# Patient Record
Sex: Male | Born: 1950
Health system: Southern US, Community
[De-identification: ages and names within clinical notes are randomized; demographics above are authoritative.]

## PROBLEM LIST (undated history)

## (undated) DIAGNOSIS — E785 Hyperlipidemia, unspecified: Secondary | ICD-10-CM

## (undated) DIAGNOSIS — E119 Type 2 diabetes mellitus without complications: Secondary | ICD-10-CM

## (undated) DIAGNOSIS — G4730 Sleep apnea, unspecified: Secondary | ICD-10-CM

## (undated) DIAGNOSIS — I10 Essential (primary) hypertension: Secondary | ICD-10-CM

## (undated) DIAGNOSIS — M1990 Unspecified osteoarthritis, unspecified site: Secondary | ICD-10-CM

## (undated) DIAGNOSIS — K219 Gastro-esophageal reflux disease without esophagitis: Secondary | ICD-10-CM

## (undated) HISTORY — DX: Type 2 diabetes mellitus without complications: E11.9

## (undated) HISTORY — PX: SPLENECTOMY: SUR1306

## (undated) HISTORY — DX: Essential (primary) hypertension: I10

## (undated) HISTORY — PX: KNEE ARTHROSCOPY: SUR90

## (undated) HISTORY — DX: Hyperlipidemia, unspecified: E78.5

## (undated) HISTORY — PX: OTHER SURGICAL HISTORY: SHX169

---
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
## 1993-09-06 HISTORY — PX: TONSILLECTOMY: SUR1361

## 2000-02-04 ENCOUNTER — Encounter: Payer: Self-pay | Admitting: Family Medicine

## 2000-02-04 ENCOUNTER — Ambulatory Visit (HOSPITAL_COMMUNITY): Admission: RE | Admit: 2000-02-04 | Discharge: 2000-02-04 | Payer: Self-pay | Admitting: Family Medicine

## 2003-05-24 ENCOUNTER — Encounter: Payer: Self-pay | Admitting: Family Medicine

## 2003-05-24 ENCOUNTER — Ambulatory Visit (HOSPITAL_COMMUNITY): Admission: RE | Admit: 2003-05-24 | Discharge: 2003-05-24 | Payer: Self-pay | Admitting: Family Medicine

## 2004-01-24 ENCOUNTER — Ambulatory Visit (HOSPITAL_COMMUNITY): Admission: RE | Admit: 2004-01-24 | Discharge: 2004-01-24 | Payer: Self-pay | Admitting: Gastroenterology

## 2004-02-17 ENCOUNTER — Encounter: Admission: RE | Admit: 2004-02-17 | Discharge: 2004-02-17 | Payer: Self-pay | Admitting: Neurosurgery

## 2004-03-02 ENCOUNTER — Encounter: Admission: RE | Admit: 2004-03-02 | Discharge: 2004-03-02 | Payer: Self-pay | Admitting: Neurosurgery

## 2004-03-18 ENCOUNTER — Encounter: Admission: RE | Admit: 2004-03-18 | Discharge: 2004-03-18 | Payer: Self-pay | Admitting: Neurosurgery

## 2015-01-06 ENCOUNTER — Encounter: Payer: BLUE CROSS/BLUE SHIELD | Attending: Family Medicine | Admitting: Dietician

## 2015-01-06 ENCOUNTER — Encounter: Payer: Self-pay | Admitting: Dietician

## 2015-01-06 VITALS — Ht 72.0 in | Wt 226.0 lb

## 2015-01-06 DIAGNOSIS — E119 Type 2 diabetes mellitus without complications: Secondary | ICD-10-CM | POA: Insufficient documentation

## 2015-01-06 DIAGNOSIS — Z713 Dietary counseling and surveillance: Secondary | ICD-10-CM | POA: Diagnosis not present

## 2015-01-06 NOTE — Progress Notes (Signed)
  Medical Nutrition Therapy:  Appt start time: 0800 end time:  0930.   Assessment:  Primary concerns today: Patient is here with his wife.  HgbA1c has increased from 6.7% to 7.3%. Patient states that he has not been diagnosed with diabetes and would like to control his blood sugar with diet and lifestyle changes.  In the past month he has decreased carbohydrate intake significantly.  He has a meter but has not yet used this.  Several surgeries on knees which makes exercise painful at times. Hx includes hypercholesterolemia, HTN, osteoarthritis, , OSA, and GERD.  Patient meets diagnostic criteria for type 2 Diabetes per American Diabetes Association Guidelines.  Patient with weight loss of 8-10 lbs in the last month since increase of HgbA1C noted and dietary changes made.  Patient lives with his wife.  He works in [employer] and travels and eats out often.  Both shop and cook.    Preferred Learning Style:   No preference indicated   Learning Readiness:   Ready  Change in progress   MEDICATIONS: see list   DIETARY INTAKE: In the past month discontinued most pasta and rice, decreased bread to 2-3 times per week rather than 1-2, times per day.  Stopped eating NABS at night.  Not a sweet eater and never liked a lot of fruit.   24-hr recall:  B (6:30 AM): 1 egg, [place] sausage or [place] slices, coffee with little creamer Snk (10:30 AM): banana occasionally L ( PM): garden salad with shredded cheese,  balsamic dressing (@[employer]) Snk ( PM):  D ( PM): Meat, non starchy vegetables Snk ( PM): Peanut butter Beverages: coffee with splash cream, diet soda, sugar free tea, smaller amounts of water, 1-2 servings wine or beer some days  Usual physical activity: walking hurts knees, enjoys golf but usually uses a cart  Estimated energy needs: 1800 calories 200 g carbohydrates 135 g protein 50 g fat  Progress Towards Goal(s):  In progress.   Nutritional Diagnosis:  NB-1.1 Food and  nutrition-related knowledge deficit As related to balance of carbohydrate, protein, and fat.  As evidenced by diet hx.    Intervention:  Nutrition counseling and diabetes education initiated. Discussed Carb Counting by food group as method of portion control, reading food labels, and benefits of increased activity. Also discussed basic physiology of Diabetes, target BG ranges pre and post meals, and A1c.   Consider adding exercise 5-6 times per week.  Begin with 15 minutes and increase as tolerated.  Find something that you enjoy. Great job on the changes that you have made. Consider increasing non starchy vegetables and adding dried beans to your meal plan.   Fiber recommendations for a male are 35 grams per day.   Consider checking your blood sugar.alternate times through the week.   Read food labels for carbohydrates and fat.   Teaching Method Utilized:  Visual Auditory Hands on  Handouts given during visit include:  Meal plan card  Label reading  HgbA1C  Snack list  Barriers to learning/adherence to lifestyle change: none  Demonstrated degree of understanding via:  Teach Back   Monitoring/Evaluation:  Dietary intake, exercise, label reading, and body weight prn.

## 2015-01-06 NOTE — Patient Instructions (Signed)
Consider adding exercise 5-6 times per week.  Begin with 15 minutes and increase as tolerated.  Find something that you enjoy. Great job on the changes that you have made. Consider increasing non starchy vegetables and adding dried beans to your meal plan.   Fiber recommendations for a male are 35 grams per day.   Consider checking your blood sugar.alternate times through the week.   Read food labels for carbohydrates and fat.

## 2018-10-02 DIAGNOSIS — G4733 Obstructive sleep apnea (adult) (pediatric): Secondary | ICD-10-CM | POA: Diagnosis not present

## 2018-10-25 DIAGNOSIS — G4733 Obstructive sleep apnea (adult) (pediatric): Secondary | ICD-10-CM | POA: Diagnosis not present

## 2018-11-20 DIAGNOSIS — R69 Illness, unspecified: Secondary | ICD-10-CM | POA: Diagnosis not present

## 2018-11-20 DIAGNOSIS — G4733 Obstructive sleep apnea (adult) (pediatric): Secondary | ICD-10-CM | POA: Diagnosis not present

## 2018-11-27 DIAGNOSIS — G4733 Obstructive sleep apnea (adult) (pediatric): Secondary | ICD-10-CM | POA: Diagnosis not present

## 2018-12-21 DIAGNOSIS — G4733 Obstructive sleep apnea (adult) (pediatric): Secondary | ICD-10-CM | POA: Diagnosis not present

## 2018-12-26 DIAGNOSIS — Z862 Personal history of diseases of the blood and blood-forming organs and certain disorders involving the immune mechanism: Secondary | ICD-10-CM | POA: Diagnosis not present

## 2018-12-26 DIAGNOSIS — K219 Gastro-esophageal reflux disease without esophagitis: Secondary | ICD-10-CM | POA: Diagnosis not present

## 2018-12-26 DIAGNOSIS — E119 Type 2 diabetes mellitus without complications: Secondary | ICD-10-CM | POA: Diagnosis not present

## 2018-12-26 DIAGNOSIS — I10 Essential (primary) hypertension: Secondary | ICD-10-CM | POA: Diagnosis not present

## 2018-12-26 DIAGNOSIS — E7800 Pure hypercholesterolemia, unspecified: Secondary | ICD-10-CM | POA: Diagnosis not present

## 2018-12-26 DIAGNOSIS — G4733 Obstructive sleep apnea (adult) (pediatric): Secondary | ICD-10-CM | POA: Diagnosis not present

## 2019-01-20 DIAGNOSIS — G4733 Obstructive sleep apnea (adult) (pediatric): Secondary | ICD-10-CM | POA: Diagnosis not present

## 2019-02-20 DIAGNOSIS — G4733 Obstructive sleep apnea (adult) (pediatric): Secondary | ICD-10-CM | POA: Diagnosis not present

## 2019-03-20 DIAGNOSIS — G4733 Obstructive sleep apnea (adult) (pediatric): Secondary | ICD-10-CM | POA: Diagnosis not present

## 2019-03-22 DIAGNOSIS — G4733 Obstructive sleep apnea (adult) (pediatric): Secondary | ICD-10-CM | POA: Diagnosis not present

## 2019-04-20 DIAGNOSIS — G4733 Obstructive sleep apnea (adult) (pediatric): Secondary | ICD-10-CM | POA: Diagnosis not present

## 2019-04-22 DIAGNOSIS — G4733 Obstructive sleep apnea (adult) (pediatric): Secondary | ICD-10-CM | POA: Diagnosis not present

## 2019-05-21 DIAGNOSIS — G4733 Obstructive sleep apnea (adult) (pediatric): Secondary | ICD-10-CM | POA: Diagnosis not present

## 2019-05-23 DIAGNOSIS — G4733 Obstructive sleep apnea (adult) (pediatric): Secondary | ICD-10-CM | POA: Diagnosis not present

## 2019-05-28 DIAGNOSIS — R69 Illness, unspecified: Secondary | ICD-10-CM | POA: Diagnosis not present

## 2019-06-22 DIAGNOSIS — G4733 Obstructive sleep apnea (adult) (pediatric): Secondary | ICD-10-CM | POA: Diagnosis not present

## 2019-06-28 DIAGNOSIS — K219 Gastro-esophageal reflux disease without esophagitis: Secondary | ICD-10-CM | POA: Diagnosis not present

## 2019-06-28 DIAGNOSIS — Z125 Encounter for screening for malignant neoplasm of prostate: Secondary | ICD-10-CM | POA: Diagnosis not present

## 2019-06-28 DIAGNOSIS — E119 Type 2 diabetes mellitus without complications: Secondary | ICD-10-CM | POA: Diagnosis not present

## 2019-06-28 DIAGNOSIS — G4733 Obstructive sleep apnea (adult) (pediatric): Secondary | ICD-10-CM | POA: Diagnosis not present

## 2019-06-28 DIAGNOSIS — I10 Essential (primary) hypertension: Secondary | ICD-10-CM | POA: Diagnosis not present

## 2019-06-28 DIAGNOSIS — Z862 Personal history of diseases of the blood and blood-forming organs and certain disorders involving the immune mechanism: Secondary | ICD-10-CM | POA: Diagnosis not present

## 2019-06-28 DIAGNOSIS — E7800 Pure hypercholesterolemia, unspecified: Secondary | ICD-10-CM | POA: Diagnosis not present

## 2019-07-06 DIAGNOSIS — Z862 Personal history of diseases of the blood and blood-forming organs and certain disorders involving the immune mechanism: Secondary | ICD-10-CM | POA: Diagnosis not present

## 2019-07-06 DIAGNOSIS — E7800 Pure hypercholesterolemia, unspecified: Secondary | ICD-10-CM | POA: Diagnosis not present

## 2019-07-06 DIAGNOSIS — R69 Illness, unspecified: Secondary | ICD-10-CM | POA: Diagnosis not present

## 2019-07-06 DIAGNOSIS — I10 Essential (primary) hypertension: Secondary | ICD-10-CM | POA: Diagnosis not present

## 2019-07-06 DIAGNOSIS — E119 Type 2 diabetes mellitus without complications: Secondary | ICD-10-CM | POA: Diagnosis not present

## 2019-07-06 DIAGNOSIS — Z125 Encounter for screening for malignant neoplasm of prostate: Secondary | ICD-10-CM | POA: Diagnosis not present

## 2019-07-23 DIAGNOSIS — G4733 Obstructive sleep apnea (adult) (pediatric): Secondary | ICD-10-CM | POA: Diagnosis not present

## 2019-07-29 DIAGNOSIS — G4733 Obstructive sleep apnea (adult) (pediatric): Secondary | ICD-10-CM | POA: Diagnosis not present

## 2019-08-22 DIAGNOSIS — G4733 Obstructive sleep apnea (adult) (pediatric): Secondary | ICD-10-CM | POA: Diagnosis not present

## 2019-08-28 DIAGNOSIS — G4733 Obstructive sleep apnea (adult) (pediatric): Secondary | ICD-10-CM | POA: Diagnosis not present

## 2019-10-05 ENCOUNTER — Ambulatory Visit: Payer: BLUE CROSS/BLUE SHIELD

## 2019-10-13 ENCOUNTER — Ambulatory Visit: Payer: BLUE CROSS/BLUE SHIELD | Attending: Internal Medicine

## 2019-10-13 DIAGNOSIS — Z23 Encounter for immunization: Secondary | ICD-10-CM | POA: Insufficient documentation

## 2019-10-13 NOTE — Progress Notes (Signed)
   Covid-19 Vaccination Clinic  Name:  Gary Woodward    MRN: [id number] DOB: 10-Dec-1950  10/13/2019  [name] was observed post Covid-19 immunization for 15 minutes without incidence. He was provided with Vaccine Information Sheet and instruction to access the V-Safe system.   [name] was instructed to call 911 with any severe reactions post vaccine: Marland Kitchen Difficulty breathing  . Swelling of your face and throat  . A fast heartbeat  . A bad rash all over your body  . Dizziness and weakness    Immunizations Administered    Name Date Dose VIS Date Route   Pfizer COVID-19 Vaccine 10/13/2019  3:05 PM 0.3 mL 08/17/2019 Intramuscular   Manufacturer: [place]   Lot: [id number]   [place]: [id number]

## 2019-10-16 ENCOUNTER — Ambulatory Visit: Payer: BLUE CROSS/BLUE SHIELD

## 2019-11-07 ENCOUNTER — Ambulatory Visit: Payer: BLUE CROSS/BLUE SHIELD | Attending: Internal Medicine

## 2019-11-07 DIAGNOSIS — Z23 Encounter for immunization: Secondary | ICD-10-CM | POA: Insufficient documentation

## 2019-11-07 NOTE — Progress Notes (Signed)
   Covid-19 Vaccination Clinic  Name:  MAKIAH CARAVANTES    MRN: [id number] DOB: [birth year]  11/07/2019  Mr. Dekam was observed post Covid-19 immunization for 15 minutes without incident. He was provided with Vaccine Information Sheet and instruction to access the V-Safe system.   Mr. Amari was instructed to call 911 with any severe reactions post vaccine: Marland Kitchen Difficulty breathing  . Swelling of face and throat  . A fast heartbeat  . A bad rash all over body  . Dizziness and weakness   Immunizations Administered    Name Date Dose VIS Date Route   Pfizer COVID-19 Vaccine 11/07/2019 10:57 AM 0.3 mL 08/17/2019 Intramuscular   Manufacturer: [place]   Lot: [id number]   [place]: [id number]

## 2020-03-06 DIAGNOSIS — I209 Angina pectoris, unspecified: Secondary | ICD-10-CM

## 2020-03-06 HISTORY — DX: Angina pectoris, unspecified: I20.9

## 2020-03-18 ENCOUNTER — Emergency Department (HOSPITAL_COMMUNITY)
Admission: EM | Admit: 2020-03-18 | Discharge: 2020-03-19 | Disposition: A | Discharge status: Home / Self Care | Payer: Medicare Other | Attending: Emergency Medicine | Admitting: Emergency Medicine

## 2020-03-18 ENCOUNTER — Emergency Department (HOSPITAL_COMMUNITY): Payer: Medicare Other

## 2020-03-18 ENCOUNTER — Other Ambulatory Visit: Payer: Self-pay

## 2020-03-18 ENCOUNTER — Encounter (HOSPITAL_COMMUNITY): Payer: Self-pay | Admitting: Emergency Medicine

## 2020-03-18 DIAGNOSIS — R0789 Other chest pain: Secondary | ICD-10-CM | POA: Insufficient documentation

## 2020-03-18 DIAGNOSIS — Z5321 Procedure and treatment not carried out due to patient leaving prior to being seen by health care provider: Secondary | ICD-10-CM | POA: Insufficient documentation

## 2020-03-18 LAB — TROPONIN I (HIGH SENSITIVITY): Troponin I (High Sensitivity): 5 ng/L (ref <18)

## 2020-03-18 LAB — CBC
HCT: 44.9 % (ref 39.0–52.0)
Hemoglobin: 14.9 g/dL (ref 13.0–17.0)
MCH: 29 pg (ref 26.0–34.0)
MCHC: 33.2 g/dL (ref 30.0–36.0)
MCV: 87.5 fL (ref 80.0–100.0)
Platelets: 343 10*3/uL (ref 150–400)
RBC: 5.13 MIL/uL (ref 4.22–5.81)
RDW: 15.1 % (ref 11.5–15.5)
WBC: 14.4 10*3/uL — ABNORMAL HIGH (ref 4.0–10.5)
nRBC: 0 % (ref 0.0–0.2)

## 2020-03-18 LAB — BASIC METABOLIC PANEL
Anion gap: 10 (ref 5–15)
BUN: 14 mg/dL (ref 8–23)
CO2: 22 mmol/L (ref 22–32)
Calcium: 9.6 mg/dL (ref 8.9–10.3)
Chloride: 105 mmol/L (ref 98–111)
Creatinine, Ser: 0.75 mg/dL (ref 0.61–1.24)
GFR calc Af Amer: >60 mL/min (ref >60)
GFR calc non Af Amer: >60 mL/min (ref >60)
Glucose, Bld: 216 mg/dL — ABNORMAL HIGH (ref 70–99)
Potassium: 4 mmol/L (ref 3.5–5.1)
Sodium: 137 mmol/L (ref 135–145)

## 2020-03-18 MED ORDER — SODIUM CHLORIDE 0.9% FLUSH: 3.0000 mL | Freq: Once | INTRAVENOUS | Status: DC

## 2020-03-18 NOTE — ED Triage Notes (Signed)
Pt c/o in and out cp on the left side radiating to his left arm. Pt denies any SOB, nausea or vomiting.

## 2020-03-19 LAB — TROPONIN I (HIGH SENSITIVITY): Troponin I (High Sensitivity): 6 ng/L (ref <18)

## 2020-03-19 NOTE — ED Notes (Signed)
Pt stated he was leaving the ED

## 2020-05-15 NOTE — Progress Notes (Signed)
Chief Complaint  Patient presents with  . New Patient (Initial Visit)    Chest pain    History of Present Illness: 69 yo male with history of DM, HTN, HLD who is here today as a new consult, referred by Dr. Kenton Kingfisher, for the evaluation of chest pressure. He had an episode of severe left sided chest pressyre with radiation of pressure into his left arm. The pain lasted for 10 minutes. This recurred in several hours. No associated dyspnea, N/V or dizziness. He has had no recurrent episodes of chest pressure over the past two months. He has had dyspnea with exertion. No LE edema.   Primary Care Physician: Shirline Frees, MD  Past Medical History:  Diagnosis Date  . Diabetes mellitus without complication ([place])   . Hyperlipidemia   . Hypertension     Past Surgical History:  Procedure Laterality Date  . right knee replacement    . SPLENECTOMY  1966    Current Outpatient Medications  Medication Sig Dispense Refill  . aspirin 81 MG tablet Take 81 mg by mouth daily.    . Coenzyme Q10 400 MG CAPS Take by mouth.    . Glucosamine-Chondroitin-MSM (TRIPLE FLEX PO) Take by mouth.    Marland Kitchen lisinopril (PRINIVIL,ZESTRIL) 10 MG tablet Take 10 mg by mouth daily.    . metFORMIN (GLUCOPHAGE) 500 MG tablet Take 500 mg by mouth daily.    . niacin (NIASPAN) 1000 MG CR tablet Take 1,000 mg by mouth at bedtime.    . nitroGLYCERIN (NITROSTAT) 0.4 MG SL tablet Place 0.4 mg under the tongue as needed.    . Omega-3 Fatty Acids (FISH OIL) 1200 MG CAPS Take by mouth.    . ranitidine (ZANTAC) 75 MG tablet Take 75 mg by mouth 2 (two) times daily.    . simvastatin (ZOCOR) 40 MG tablet Take 40 mg by mouth daily.     No current facility-administered medications for this visit.    Allergies  Allergen Reactions  . Sulfa Antibiotics     Social History   Socioeconomic History  . Marital status: Married    Spouse name: Not on file  . Number of children: 3  . Years of education: Not on file  . Highest  education level: Not on file  Occupational History  . Occupation: Retired-Sales  Tobacco Use  . Smoking status: Former Smoker    Packs/day: 1.00    Years: 20.00    Pack years: 20.00    Types: Cigarettes    Quit date: 09/06/1988    Years since quitting: 31.7  . Smokeless tobacco: Never Used  Substance and Sexual Activity  . Alcohol use: Never  . Drug use: Never  . Sexual activity: Not on file  Other Topics Concern  . Not on file  Social History Narrative  . Not on file   Social Determinants of Health   Financial Resource Strain:   . Difficulty of Paying Living Expenses: Not on file  Food Insecurity:   . Worried About Charity fundraiser in the Last Year: Not on file  . Ran Out of Food in the Last Year: Not on file  Transportation Needs:   . Lack of Transportation (Medical): Not on file  . Lack of Transportation (Non-Medical): Not on file  Physical Activity:   . Days of Exercise per Week: Not on file  . Minutes of Exercise per Session: Not on file  Stress:   . Feeling of Stress : Not on file  Social Connections:   . Frequency of Communication with Friends and Family: Not on file  . Frequency of Social Gatherings with Friends and Family: Not on file  . Attends Religious Services: Not on file  . Active Member of Clubs or Organizations: Not on file  . Attends Archivist Meetings: Not on file  . Marital Status: Not on file  Intimate Partner Violence:   . Fear of Current or Ex-Partner: Not on file  . Emotionally Abused: Not on file  . Physically Abused: Not on file  . Sexually Abused: Not on file    Family History  Problem Relation Age of Onset  . Heart attack Mother 8  . Stroke Father     Review of Systems:  As stated in the HPI and otherwise negative.   BP 114/80   Pulse (!) 59   Ht 6' (1.829 m)   Wt 244 lb 6.4 oz (110.9 kg)   SpO2 96%   BMI 33.15 kg/m   Physical Examination: General: Well developed, well nourished, NAD  HEENT: OP clear, mucus  membranes moist  SKIN: warm, dry. No rashes. Neuro: No focal deficits  Musculoskeletal: Muscle strength 5/5 all ext  Psychiatric: Mood and affect normal  Neck: No JVD, no carotid bruits, no thyromegaly, no lymphadenopathy.  Lungs:Clear bilaterally, no wheezes, rhonci, crackles Cardiovascular: Regular rate and rhythm. No murmurs, gallops or rubs. Abdomen:Soft. Bowel sounds present. Non-tender.  Extremities: No lower extremity edema. Pulses are 2 + in the bilateral DP/PT.  EKG:  EKG is ordered today. The ekg ordered today demonstrates sinus brady, rate 59 bpm. Possible old inferior MI.   Recent Labs: 03/18/2020: BUN 14; Creatinine, Ser 0.75; Hemoglobin 14.9; Platelets 343; Potassium 4.0; Sodium 137   Lipid Panel No results found for: CHOL, TRIG, HDL, CHOLHDL, VLDL, LDLCALC, LDLDIRECT   Wt Readings from Last 3 Encounters:  05/16/20 244 lb 6.4 oz (110.9 kg)  03/18/20 225 lb 5 oz (102.2 kg)  01/06/15 226 lb (102.5 kg)      Assessment and Plan:   1. Chest pain: He has had several episodes of chest pain/pressure and ongoing dyspnea on exertion. Risk factors for CAD include age, DM, HTN, HLD and FH of CAD. Will arrange a gated cardiac CTA to exclude CAD. Will arrange an echo to exclude structural heart disease, assess LVEF.   Current medicines are reviewed at length with the patient today.  The patient does not have concerns regarding medicines.  The following changes have been made:  no change  Labs/ tests ordered today include:   Orders Placed This Encounter  Procedures  . CT CORONARY MORPH W/CTA COR W/SCORE W/CA W/CM &/OR WO/CM  . CT CORONARY FRACTIONAL FLOW RESERVE DATA PREP  . CT CORONARY FRACTIONAL FLOW RESERVE FLUID ANALYSIS  . Basic metabolic panel  . EKG 12-Lead  . ECHOCARDIOGRAM COMPLETE     Disposition:   FU with me in 8 weeks.    Signed, Lauree Chandler, MD 05/16/2020 9:02 AM    [facility] [place], [place], [place]   [address] Phone: [phone]; Fax: [phone]

## 2020-05-16 ENCOUNTER — Other Ambulatory Visit: Payer: Self-pay

## 2020-05-16 ENCOUNTER — Encounter: Payer: Self-pay | Admitting: Cardiovascular Disease

## 2020-05-16 ENCOUNTER — Ambulatory Visit: Payer: Medicare Other | Admitting: Cardiovascular Disease

## 2020-05-16 DIAGNOSIS — R079 Chest pain, unspecified: Secondary | ICD-10-CM

## 2020-05-16 LAB — BASIC METABOLIC PANEL
BUN/Creatinine Ratio: 15 (ref 10–24)
BUN: 13 mg/dL (ref 8–27)
CO2: 21 mmol/L (ref 20–29)
Calcium: 10 mg/dL (ref 8.6–10.2)
Chloride: 102 mmol/L (ref 96–106)
Creatinine, Ser: 0.88 mg/dL (ref 0.76–1.27)
GFR calc Af Amer: 102 mL/min/{1.73_m2} (ref >59)
GFR calc non Af Amer: 88 mL/min/{1.73_m2} (ref >59)
Glucose: 152 mg/dL — ABNORMAL HIGH (ref 65–99)
Potassium: 4.8 mmol/L (ref 3.5–5.2)
Sodium: 136 mmol/L (ref 134–144)

## 2020-05-16 NOTE — Patient Instructions (Addendum)
Medication Instructions:  Your provider recommends that you continue on your current medications as directed. Please refer to the Current Medication list given to you today.   *If you need a refill on your cardiac medications before your next appointment, please call your pharmacy*  Lab Work: TODAY! BMET If you have labs (blood work) drawn today and your tests are completely normal, you will receive your results only by: Marland Kitchen MyChart Message (if you have MyChart) OR . A paper copy in the mail If you have any lab test that is abnormal or we need to change your treatment, we will call you to review the results.  Testing/Procedures: Your physician has requested that you have an echocardiogram. Echocardiography is a painless test that uses sound waves to create images of your heart. It provides your doctor with information about the size and shape of your heart and how well your heart's chambers and valves are working. This procedure takes approximately one hour. There are no restrictions for this procedure.  Dr. Angelena Form recommends you have a CORONARY CT.  Follow-Up: At [facility], you and your health needs are our priority.  As part of our continuing mission to provide you with exceptional heart care, we have created designated Provider Care Teams.  These Care Teams include your primary Cardiologist (physician) and Advanced Practice Providers (APPs -  Physician Assistants and Nurse Practitioners) who all work together to provide you with the care you need, when you need it. Your next appointment:   8 weeks The format for your next appointment:   In Person Provider:   You may see Lauree Chandler, MD or one of the following Advanced Practice Providers on your designated Care Team:    Richardson Dopp, PA-C  [place]in [place], [place]     CT INSTRUCTIONS: Your cardiac CT will be scheduled at one of the below locations:   [facility] [address] [place], [place] [address] [phone]  [place] [address] [place], [place] [address] [phone]  If scheduled at [facility], please arrive at the [facility] main entrance of [facility] 30 minutes prior to test start time. Proceed to the [facility] Radiology Department (first floor) to check-in and test prep.  If scheduled at [facility], please arrive 15 mins early for check-in and test prep.  Please follow these instructions carefully:  Hold all erectile dysfunction medications at least 3 days (72 hrs) prior to test.  On the Night Before the Test: . Be sure to Drink plenty of water. . Do not consume any caffeinated/decaffeinated beverages or chocolate 12 hours prior to your test. . Do not take any antihistamines 12 hours prior to your test.  On the Day of the Test: . Drink plenty of water. Do not drink any water within one hour of the test. . Do not eat any food 4 hours prior to the test. . You may take your regular medications prior to the test EXCEPT: HOLD METFORMIN the morning of your CT  After the Test: . Drink plenty of water. . After receiving IV contrast, you may experience a mild flushed feeling. This is normal. . On occasion, you may experience a mild rash up to 24 hours after the test. This is not dangerous. If this occurs, you can take Benadryl 25 mg and increase your fluid intake. . If you experience trouble breathing, this can be serious. If it is severe  call 911 IMMEDIATELY. If it is mild, please call our office. Marland Kitchen HOLD METFORMIN for 48 hours after your CT.  Once we have confirmed authorization from your insurance company, we will call you to set up a date and time for your test. Based on how quickly your insurance processes prior authorizations requests, please allow up to 4 weeks to be contacted for scheduling your Cardiac CT appointment. Be advised that routine Cardiac CT appointments could  be scheduled as many as 8 weeks after your provider has ordered it.  For non-scheduling related questions, please contact the cardiac imaging nurse navigator should you have any questions/concerns: Marchia Bond, Cardiac Imaging Nurse Navigator Burley Saver, Interim Cardiac Imaging Nurse [facility] Direct Office Dial: [phone]   For scheduling needs, including cancellations and rescheduling, please call Vivien Rota at [phone], option 3.

## 2020-05-20 ENCOUNTER — Telehealth (HOSPITAL_COMMUNITY): Payer: Self-pay | Admitting: Emergency Medicine

## 2020-05-20 NOTE — Telephone Encounter (Signed)
Attempted to call patient regarding upcoming cardiac CT appointment. °Left message on voicemail with name and callback number °Laurene Melendrez RN Navigator Cardiac Imaging °[facility] °[phone] Office °[phone] Cell ° °

## 2020-05-22 ENCOUNTER — Ambulatory Visit
Admission: RE | Admit: 2020-05-22 | Discharge: 2020-05-22 | Disposition: A | Discharge status: Home / Self Care | Payer: Medicare Other | Source: Ambulatory Visit | Attending: Cardiovascular Disease | Admitting: Cardiovascular Disease

## 2020-05-22 ENCOUNTER — Other Ambulatory Visit: Payer: Self-pay

## 2020-05-22 DIAGNOSIS — Z87891 Personal history of nicotine dependence: Secondary | ICD-10-CM

## 2020-05-22 DIAGNOSIS — R079 Chest pain, unspecified: Secondary | ICD-10-CM

## 2020-05-22 DIAGNOSIS — I2510 Atherosclerotic heart disease of native coronary artery without angina pectoris: Secondary | ICD-10-CM | POA: Diagnosis not present

## 2020-05-22 MED ORDER — IOHEXOL 350 MG/ML SOLN
85.0000 mL | Freq: Once | INTRAVENOUS | Status: AC | PRN
  Administered 2020-05-22: 85 mL via INTRAVENOUS

## 2020-05-22 MED ORDER — NITROGLYCERIN 0.4 MG SL SUBL
0.8000 mg | SUBLINGUAL_TABLET | Freq: Once | SUBLINGUAL | Status: AC
  Administered 2020-05-22: 0.8 mg via SUBLINGUAL

## 2020-05-22 MED ORDER — METOPROLOL TARTRATE 5 MG/5ML IV SOLN
10.0000 mg | Freq: Once | INTRAVENOUS | Status: AC
  Administered 2020-05-22: 10 mg via INTRAVENOUS

## 2020-05-22 NOTE — Progress Notes (Signed)
Patient tolerated CT well. Drank water, a soda, and ate peanut butter cracker. Ambulated to exit steady gait.

## 2020-05-26 DIAGNOSIS — R079 Chest pain, unspecified: Secondary | ICD-10-CM | POA: Diagnosis not present

## 2020-05-26 DIAGNOSIS — Z87891 Personal history of nicotine dependence: Secondary | ICD-10-CM | POA: Diagnosis not present

## 2020-05-26 DIAGNOSIS — Z6833 Body mass index (BMI) 33.0-33.9, adult: Secondary | ICD-10-CM

## 2020-05-28 ENCOUNTER — Other Ambulatory Visit: Payer: Self-pay

## 2020-05-28 ENCOUNTER — Ambulatory Visit (HOSPITAL_COMMUNITY): Payer: Medicare Other | Attending: Cardiology

## 2020-05-28 DIAGNOSIS — R079 Chest pain, unspecified: Secondary | ICD-10-CM | POA: Diagnosis not present

## 2020-05-28 LAB — ECHOCARDIOGRAM COMPLETE
Area-P 1/2: 3.53 cm2
S' Lateral: 3 cm

## 2020-07-16 ENCOUNTER — Ambulatory Visit: Payer: Medicare Other | Admitting: Cardiovascular Disease

## 2020-07-16 ENCOUNTER — Encounter: Payer: Self-pay | Admitting: Cardiovascular Disease

## 2020-07-16 ENCOUNTER — Other Ambulatory Visit: Payer: Self-pay

## 2020-07-16 VITALS — BP 104/68 | HR 72 | Ht 72.0 in | Wt 206.8 lb

## 2020-07-16 DIAGNOSIS — I2510 Atherosclerotic heart disease of native coronary artery without angina pectoris: Secondary | ICD-10-CM

## 2020-07-16 NOTE — Progress Notes (Signed)
Chief Complaint  Patient presents with  . Follow-up    CAD   History of Present Illness: 69 yo Woodward with history of DM, HTN, HLD who is here today for cardiac follow up. I saw him as a new consul in September 2021 for the evaluation of chest pressure. He had an episode of severe left sided chest pressyre with radiation of pressure into his left arm. The pain lasted for 10 minutes. This recurred in several hours. No associated dyspnea, N/V or dizziness. No chest pain in the two months prior to his first visit in our office. He has had dyspnea with exertion. No LE edema. Echo 05/28/20 with LVEF=60-65%. Gated cardiac CTA September 2021 with mild to moderate CAD, no focally obstructive lesions by CT FFR.   He is here today for follow up. The patient denies any chest pain, dyspnea, palpitations, lower extremity edema, orthopnea, PND, dizziness, near syncope or syncope.   Primary Care Physician: Shirline Frees, MD  Past Medical History:  Diagnosis Date  . Diabetes mellitus without complication ([place])   . Hyperlipidemia   . Hypertension     Past Surgical History:  Procedure Laterality Date  . right knee replacement    . SPLENECTOMY  1966    Current Outpatient Medications  Medication Sig Dispense Refill  . aspirin 81 MG tablet Take 81 mg by mouth daily.    . Coenzyme Q10 400 MG CAPS Take by mouth.    . Glucosamine-Chondroitin-MSM (TRIPLE FLEX PO) Take by mouth.    Marland Kitchen lisinopril (PRINIVIL,ZESTRIL) 10 MG tablet Take 10 mg by mouth daily.    . metFORMIN (GLUCOPHAGE-XR) 500 MG 24 hr tablet Take 1,000 mg by mouth in the morning.    . niacin (NIASPAN) 1000 MG CR tablet Take 1,000 mg by mouth at bedtime.    . nitroGLYCERIN (NITROSTAT) 0.4 MG SL tablet Place 0.4 mg under the tongue as needed.    . Omega-3 Fatty Acids (FISH OIL) 1200 MG CAPS Take by mouth.    . ranitidine (ZANTAC) 75 MG tablet Take 75 mg by mouth 2 (two) times daily.    . simvastatin (ZOCOR) 40 MG tablet Take 40 mg by mouth  daily.     No current facility-administered medications for this visit.    Allergies  Allergen Reactions  . Sulfa Antibiotics     Social History   Socioeconomic History  . Marital status: Married    Spouse name: Not on file  . Number of children: 3  . Years of education: Not on file  . Highest education level: Not on file  Occupational History  . Occupation: Retired-Sales  Tobacco Use  . Smoking status: Former Smoker    Packs/day: 1.00    Years: 20.00    Pack years: 20.00    Types: Cigarettes    Quit date: 09/06/1988    Years since quitting: 31.8  . Smokeless tobacco: Never Used  Substance and Sexual Activity  . Alcohol use: Never  . Drug use: Never  . Sexual activity: Not on file  Other Topics Concern  . Not on file  Social History Narrative  . Not on file   Social Determinants of Health   Financial Resource Strain:   . Difficulty of Paying Living Expenses: Not on file  Food Insecurity:   . Worried About Charity fundraiser in the Last Year: Not on file  . Ran Out of Food in the Last Year: Not on file  Transportation Needs:   .  Lack of Transportation (Medical): Not on file  . Lack of Transportation (Non-Medical): Not on file  Physical Activity:   . Days of Exercise per Week: Not on file  . Minutes of Exercise per Session: Not on file  Stress:   . Feeling of Stress : Not on file  Social Connections:   . Frequency of Communication with Friends and Family: Not on file  . Frequency of Social Gatherings with Friends and Family: Not on file  . Attends Religious Services: Not on file  . Active Member of Clubs or Organizations: Not on file  . Attends Archivist Meetings: Not on file  . Marital Status: Not on file  Intimate Partner Violence:   . Fear of Current or Ex-Partner: Not on file  . Emotionally Abused: Not on file  . Physically Abused: Not on file  . Sexually Abused: Not on file    Family History  Problem Relation Age of Onset  . Heart  attack Mother 76  . Stroke Father     Review of Systems:  As stated in the HPI and otherwise negative.   BP 104/68   Pulse 72   Ht 6' (1.829 m)   Wt 206 lb 12.8 oz (93.8 kg)   SpO2 95%   BMI 28.05 kg/m   Physical Examination: General: Well developed, well nourished, NAD  HEENT: OP clear, mucus membranes moist  SKIN: warm, dry. No rashes. Neuro: No focal deficits  Musculoskeletal: Muscle strength 5/5 all ext  Psychiatric: Mood and affect normal  Neck: No JVD, no carotid bruits, no thyromegaly, no lymphadenopathy.  Lungs:Clear bilaterally, no wheezes, rhonci, crackles Cardiovascular: Regular rate and rhythm. No murmurs, gallops or rubs. Abdomen:Soft. Bowel sounds present. Non-tender.  Extremities: No lower extremity edema. Pulses are 2 + in the bilateral DP/PT.  EKG:  EKG is not ordered today. The ekg ordered today demonstrates   Echo 05/28/20: 1. Left ventricular ejection fraction, by estimation, is 60 to 65%. The  left ventricle has normal function. The left ventricle has no regional  wall motion abnormalities. There is mild left ventricular hypertrophy.  Left ventricular diastolic parameters  were normal. GLS -21.8%, normal.  2. Right ventricular systolic function is normal. The right ventricular  size is normal. There is normal pulmonary artery systolic pressure. The  estimated right ventricular systolic pressure is 03.4 mmHg.  3. Left atrial size was mildly dilated.  4. The mitral valve is normal in structure. Trivial mitral valve  regurgitation. No evidence of mitral stenosis.  5. The aortic valve is tricuspid. Aortic valve regurgitation is not  visualized. No aortic stenosis is present.  6. Aortic dilatation noted. There is mild dilatation of the ascending  aorta, measuring 38 mm.  7. The inferior vena cava is normal in size with greater than 50%  respiratory variability, suggesting right atrial pressure of 3 mmHg.   Recent Labs: 03/18/2020: Hemoglobin  14.9; Platelets 343 05/16/2020: BUN 13; Creatinine, Ser 0.88; Potassium 4.8; Sodium 136   Lipid Panel No results found for: CHOL, TRIG, HDL, CHOLHDL, VLDL, LDLCALC, LDLDIRECT   Wt Readings from Last 3 Encounters:  07/16/20 206 lb 12.8 oz (93.8 kg)  05/16/20 204 lb (92.5 kg)  05/16/20 244 lb 6.4 oz (110.9 kg)      Assessment and Plan:   1. CAD without angina: Mild to moderate non-obstructive CAD by cardiac CTA September 2021. Continue ASA and statin.   Current medicines are reviewed at length with the patient today.  The patient does  not have concerns regarding medicines.  The following changes have been made:  no change  Labs/ tests ordered today include:   No orders of the defined types were placed in this encounter.    Disposition:   FU with me in 12 months.     Signed, Lauree Chandler, MD 07/16/2020 2:13 PM    [facility] [place], [place], [place]  [address] Phone: [phone]; Fax: [phone]

## 2020-07-16 NOTE — Patient Instructions (Signed)

## 2020-08-21 ENCOUNTER — Telehealth: Payer: Self-pay | Admitting: *Deleted

## 2020-08-21 NOTE — Telephone Encounter (Signed)
Pt walked in and dropped off his clearance form that was given to him today from his visit with Dr. Ronnie Derby. I called and left a message for Dr. Ruel Favors office to please fax over a formal clearance request with complete information, procedure, any meds to be held, type of anesthesia, date of surgery is scheduled. Once our office receives clearance request with have Pre op Team review for clearance.

## 2020-08-21 NOTE — Telephone Encounter (Signed)
Our office received a fax today for clearance though this is not complete. I tried to call Dr. Ruel Favors office to obtain complete information for surgery.

## 2020-08-22 NOTE — Telephone Encounter (Signed)
   [facility] Pre-operative Risk Assessment    HEARTCARE STAFF: - Please ensure there is not already an duplicate clearance open for this procedure. - Under Visit Info/Reason for Call, type in Other and utilize the format Clearance MM/DD/YY or Clearance TBD. Do not use dashes or single digits. - If request is for dental extraction, please clarify the # of teeth to be extracted.  Request for surgical clearance:  1. What type of surgery is being performed? LEFT KNEE UNI MEDIAL   2. When is this surgery scheduled? TBD   3. What type of clearance is required (medical clearance vs. Pharmacy clearance to hold med vs. Both)? MEDICAL  4. Are there any medications that need to be held prior to surgery and how long? ASA    5. Practice name and name of physician performing surgery? [facility]; DR. Annie Main LUCEY   6. What is the office phone number? [phone]   7.   What is the office fax number? [phone]  8.   Anesthesia type (None, local, MAC, general) ? SPINAL   Julaine Hua 08/22/2020, 9:26 AM  _________________________________________________________________   (provider comments below)

## 2020-08-24 NOTE — Telephone Encounter (Signed)
   Primary Cardiologist: Lauree Chandler, MD  Chart reviewed as part of pre-operative protocol coverage. Patient was contacted 08/24/2020 in reference to pre-operative risk assessment for pending surgery as outlined below.  ABDULHADI STOPA was last seen on 07/16/2020 by Dr. Angelena Form.  Since that day, CAROLL CUNNINGTON has done well without chest pain or shortness of breath.  Recent echocardiogram and coronary CT results are quite reassuring.  Given his moderate coronary artery disease, we would recommend continue aspirin through the surgery if possible, however if absolutely needed, may come off aspirin for 5 to 7 days prior to the surgery and restart as soon as possible afterward.  Therefore, based on ACC/AHA guidelines, the patient would be at acceptable risk for the planned procedure without further cardiovascular testing.   The patient was advised that if he develops new symptoms prior to surgery to contact our office to arrange for a follow-up visit, and he verbalized understanding.  I will route this recommendation to the requesting party via Epic fax function and remove from pre-op pool. Please call with questions.  [place], [place] 08/24/2020, 12:16 PM

## 2020-09-18 DIAGNOSIS — Z86010 Personal history of colonic polyps: Secondary | ICD-10-CM | POA: Diagnosis not present

## 2020-09-18 DIAGNOSIS — K2100 Gastro-esophageal reflux disease with esophagitis, without bleeding: Secondary | ICD-10-CM | POA: Diagnosis not present

## 2020-09-18 DIAGNOSIS — K5730 Diverticulosis of large intestine without perforation or abscess without bleeding: Secondary | ICD-10-CM | POA: Diagnosis not present

## 2020-09-21 DIAGNOSIS — G4733 Obstructive sleep apnea (adult) (pediatric): Secondary | ICD-10-CM | POA: Diagnosis not present

## 2020-10-16 NOTE — Patient Instructions (Addendum)
DUE TO COVID-19 ONLY ONE VISITOR IS ALLOWED TO COME WITH YOU AND STAY IN THE WAITING ROOM ONLY DURING PRE OP AND PROCEDURE DAY OF SURGERY. THE 1 VISITOR  MAY VISIT WITH YOU AFTER SURGERY IN YOUR PRIVATE ROOM DURING VISITING HOURS ONLY!  YOU NEED TO HAVE A COVID 19 TEST ON_2/17______ @1 :30______, THIS TEST MUST BE DONE BEFORE SURGERY,  COVID TESTING SITE [address] [place] [address], IT IS ON THE RIGHT GOING OUT [address] APPROXIMATELY  2 MINUTES PAST [employer] ON THE RIGHT. ONCE YOUR COVID TEST IS COMPLETED,  PLEASE BEGIN THE QUARANTINE INSTRUCTIONS AS OUTLINED IN YOUR HANDOUT.                Gary Woodward    Your procedure is scheduled on: 10/27/20   Report to [facility] Main  Entrance   Report to admitting at  6:45 AM     Call this number if you have problems the morning of surgery [phone]    Remember: Do not eat food or drink liquids :After Midnight  . BRUSH YOUR TEETH MORNING OF SURGERY AND RINSE YOUR MOUTH OUT, NO CHEWING GUM CANDY OR MINTS.   No food after midnight.    You may have clear liquid until 6:00 AM.    At 5:30 AM drink pre surgery drink.   Nothing by mouth after 6:00 AM.   Take these medicines the morning of surgery with A SIP OF WATER: Omeprozole                         How to Manage Your Diabetes Before and After Surgery  Why is it important to control my blood sugar before and after surgery? . Improving blood sugar levels before and after surgery helps healing and can limit problems. . A way of improving blood sugar control is eating a healthy diet by: o  Eating less sugar and carbohydrates o  Increasing activity/exercise o  Talking with your doctor about reaching your blood sugar goals . High blood sugars (greater than 180 mg/dL) can raise your risk of infections and slow your recovery, so you will need to focus on controlling your diabetes during the weeks before surgery. . Make sure that the doctor who  takes care of your diabetes knows about your planned surgery including the date and location.  How do I manage my blood sugar before surgery? . Check your blood sugar at least 4 times a day, starting 2 days before surgery, to make sure that the level is not too high or low. o Check your blood sugar the morning of your surgery when you wake up and every 2 hours until you get to the Short Stay unit. . If your blood sugar is less than 70 mg/dL, you will need to treat for low blood sugar: o Do not take insulin. o Treat a low blood sugar (less than 70 mg/dL) with  cup of clear juice (cranberry or apple), 4 glucose tablets, OR glucose gel. o Recheck blood sugar in 15 minutes after treatment (to make sure it is greater than 70 mg/dL). If your blood sugar is not greater than 70 mg/dL on recheck, call [phone] for further instructions. . Report your blood sugar to the short stay nurse when you get to Short Stay.  . If you are admitted to the hospital after surgery: o Your blood sugar will be checked by the staff and you will probably be  given insulin after surgery (instead of oral diabetes medicines) to make sure you have good blood sugar levels. o The goal for blood sugar control after surgery is 80-180 mg/dL.   WHAT DO I DO ABOUT MY DIABETES MEDICATION?  Marland Kitchen Do not take oral diabetes medicines (pills) the morning of surgery.          Do not bring valuables to the hospital. [place].  Contacts, dentures or bridgework may not be worn into surgery.      Patients discharged the day of surgery will not be allowed to drive home.  IF YOU ARE HAVING SURGERY AND GOING HOME THE SAME DAY, YOU MUST HAVE AN ADULT TO DRIVE YOU HOME AND BE WITH YOU FOR 24 HOURS. YOU MAY GO HOME BY TAXI OR UBER OR ORTHERWISE, BUT AN ADULT MUST ACCOMPANY YOU HOME AND STAY WITH YOU FOR 24 HOURS.  Name and phone number of your driver:  Special Instructions: N/A               Please read over the following fact sheets you were given: _____________________________________________________________________             [facility] - Preparing for Surgery Before surgery, you can play an important role.  Because skin is not sterile, your skin needs to be as free of germs as possible.  You can reduce the number of germs on your skin by washing with CHG (chlorahexidine gluconate) soap before surgery.  CHG is an antiseptic cleaner which kills germs and bonds with the skin to continue killing germs even after washing. Please DO NOT use if you have an allergy to CHG or antibacterial soaps.  If your skin becomes reddened/irritated stop using the CHG and inform your nurse when you arrive at Short Stay.   You may shave your face/neck. Please follow these instructions carefully:  1.  Shower with CHG Soap the night before surgery and the  morning of Surgery.  2.  If you choose to wash your hair, wash your hair first as usual with your  normal  shampoo.  3.  After you shampoo, rinse your hair and body thoroughly to remove the  shampoo.                                        4.  Use CHG as you would any other liquid soap.  You can apply chg directly  to the skin and wash                       Gently with a scrungie or clean washcloth.  5.  Apply the CHG Soap to your body ONLY FROM THE NECK DOWN.   Do not use on face/ open                           Wound or open sores. Avoid contact with eyes, ears mouth and genitals (private parts).                       Wash face,  Genitals (private parts) with your normal soap.             6.  Wash thoroughly, paying special attention to  the area where your surgery  will be performed.  7.  Thoroughly rinse your body with warm water from the neck down.  8.  DO NOT shower/wash with your normal soap after using and rinsing off  the CHG Soap.             9.  Pat yourself dry with a clean towel.            10.  Wear clean pajamas.            11.   Place clean sheets on your bed the night of your first shower and do not  sleep with pets. Day of Surgery : Do not apply any lotions/deodorants the morning of surgery.  Please wear clean clothes to the hospital/surgery center.  FAILURE TO FOLLOW THESE INSTRUCTIONS MAY RESULT IN THE CANCELLATION OF YOUR SURGERY PATIENT SIGNATURE_________________________________  NURSE SIGNATURE__________________________________  ________________________________________________________________________   Adam Phenix  An incentive spirometer is a tool that can help keep your lungs clear and active. This tool measures how well you are filling your lungs with each breath. Taking long deep breaths may help reverse or decrease the chance of developing breathing (pulmonary) problems (especially infection) following:  A long period of time when you are unable to move or be active. BEFORE THE PROCEDURE   If the spirometer includes an indicator to show your best effort, your nurse or respiratory therapist will set it to a desired goal.  If possible, sit up straight or lean slightly forward. Try not to slouch.  Hold the incentive spirometer in an upright position. INSTRUCTIONS FOR USE  1. Sit on the edge of your bed if possible, or sit up as far as you can in bed or on a chair. 2. Hold the incentive spirometer in an upright position. 3. Breathe out normally. 4. Place the mouthpiece in your mouth and seal your lips tightly around it. 5. Breathe in slowly and as deeply as possible, raising the piston or the ball toward the top of the column. 6. Hold your breath for 3-5 seconds or for as long as possible. Allow the piston or ball to fall to the bottom of the column. 7. Remove the mouthpiece from your mouth and breathe out normally. 8. Rest for a few seconds and repeat Steps 1 through 7 at least 10 times every 1-2 hours when you are awake. Take your time and take a few normal breaths between deep  breaths. 9. The spirometer may include an indicator to show your best effort. Use the indicator as a goal to work toward during each repetition. 10. After each set of 10 deep breaths, practice coughing to be sure your lungs are clear. If you have an incision (the cut made at the time of surgery), support your incision when coughing by placing a pillow or rolled up towels firmly against it. Once you are able to get out of bed, walk around indoors and cough well. You may stop using the incentive spirometer when instructed by your caregiver.  RISKS AND COMPLICATIONS  Take your time so you do not get dizzy or light-headed.  If you are in pain, you may need to take or ask for pain medication before doing incentive spirometry. It is harder to take a deep breath if you are having pain. AFTER USE  Rest and breathe slowly and easily.  It can be helpful to keep track of a log of your progress. Your caregiver can provide you with a simple table  to help with this. If you are using the spirometer at home, follow these instructions: [place] IF:   You are having difficultly using the spirometer.  You have trouble using the spirometer as often as instructed.  Your pain medication is not giving enough relief while using the spirometer.  You develop fever of 100.5 F (38.1 C) or higher. SEEK IMMEDIATE MEDICAL CARE IF:   You cough up bloody sputum that had not been present before.  You develop fever of 102 F (38.9 C) or greater.  You develop worsening pain at or near the incision site. MAKE SURE YOU:   Understand these instructions.  Will watch your condition.  Will get help right away if you are not doing well or get worse. Document Released: 01/03/2007 Document Revised: 11/15/2011 Document Reviewed: 03/06/2007 [facility] Patient Information 2014 [place], [place].   ________________________________________________________________________

## 2020-10-17 ENCOUNTER — Other Ambulatory Visit: Payer: Self-pay | Admitting: Orthopedic Surgery

## 2020-10-17 ENCOUNTER — Encounter (HOSPITAL_COMMUNITY): Payer: Self-pay

## 2020-10-17 ENCOUNTER — Other Ambulatory Visit: Payer: Self-pay

## 2020-10-17 ENCOUNTER — Encounter (HOSPITAL_COMMUNITY)
Admission: RE | Admit: 2020-10-17 | Discharge: 2020-10-17 | Disposition: A | Discharge status: Home / Self Care | Payer: Medicare Other | Source: Ambulatory Visit | Attending: Orthopedic Surgery | Admitting: Orthopedic Surgery

## 2020-10-17 DIAGNOSIS — Z01812 Encounter for preprocedural laboratory examination: Secondary | ICD-10-CM | POA: Insufficient documentation

## 2020-10-17 HISTORY — DX: Sleep apnea, unspecified: G47.30

## 2020-10-17 HISTORY — DX: Unspecified osteoarthritis, unspecified site: M19.90

## 2020-10-17 HISTORY — DX: Gastro-esophageal reflux disease without esophagitis: K21.9

## 2020-10-17 LAB — CBC
HCT: 48.5 % (ref 39.0–52.0)
Hemoglobin: 15.8 g/dL (ref 13.0–17.0)
MCH: 29.4 pg (ref 26.0–34.0)
MCHC: 32.6 g/dL (ref 30.0–36.0)
MCV: 90.1 fL (ref 80.0–100.0)
Platelets: 364 10*3/uL (ref 150–400)
RBC: 5.38 MIL/uL (ref 4.22–5.81)
RDW: 14.8 % (ref 11.5–15.5)
WBC: 13 10*3/uL — ABNORMAL HIGH (ref 4.0–10.5)
nRBC: 0 % (ref 0.0–0.2)

## 2020-10-17 LAB — BASIC METABOLIC PANEL
Anion gap: 6 (ref 5–15)
BUN: 14 mg/dL (ref 8–23)
CO2: 26 mmol/L (ref 22–32)
Calcium: 9.8 mg/dL (ref 8.9–10.3)
Chloride: 101 mmol/L (ref 98–111)
Creatinine, Ser: 0.87 mg/dL (ref 0.61–1.24)
GFR, Estimated: >60 mL/min (ref >60)
Glucose, Bld: 163 mg/dL — ABNORMAL HIGH (ref 70–99)
Potassium: 5.3 mmol/L — ABNORMAL HIGH (ref 3.5–5.1)
Sodium: 133 mmol/L — ABNORMAL LOW (ref 135–145)

## 2020-10-17 LAB — COMPREHENSIVE METABOLIC PANEL
ALT: 21 U/L (ref 0–44)
AST: 18 U/L (ref 15–41)
Albumin: 4.4 g/dL (ref 3.5–5.0)
Alkaline Phosphatase: 53 U/L (ref 38–126)
Anion gap: 12 (ref 5–15)
BUN: 14 mg/dL (ref 8–23)
CO2: 25 mmol/L (ref 22–32)
Calcium: 10 mg/dL (ref 8.9–10.3)
Chloride: 102 mmol/L (ref 98–111)
Creatinine, Ser: 0.88 mg/dL (ref 0.61–1.24)
GFR, Estimated: >60 mL/min (ref >60)
Glucose, Bld: 160 mg/dL — ABNORMAL HIGH (ref 70–99)
Potassium: 5.5 mmol/L — ABNORMAL HIGH (ref 3.5–5.1)
Sodium: 139 mmol/L (ref 135–145)
Total Bilirubin: 0.8 mg/dL (ref 0.3–1.2)
Total Protein: 7.9 g/dL (ref 6.5–8.1)

## 2020-10-17 LAB — DIFFERENTIAL
Abs Immature Granulocytes: 0.06 10*3/uL (ref 0.00–0.07)
Basophils Absolute: 0.1 10*3/uL (ref 0.0–0.1)
Basophils Relative: 0 %
Eosinophils Absolute: 0.3 10*3/uL (ref 0.0–0.5)
Eosinophils Relative: 3 %
Immature Granulocytes: 1 %
Lymphocytes Relative: 22 %
Lymphs Abs: 3 10*3/uL (ref 0.7–4.0)
Monocytes Absolute: 1.8 10*3/uL — ABNORMAL HIGH (ref 0.1–1.0)
Monocytes Relative: 13 %
Neutro Abs: 8.1 10*3/uL — ABNORMAL HIGH (ref 1.7–7.7)
Neutrophils Relative %: 61 %

## 2020-10-17 LAB — SURGICAL PCR SCREEN
MRSA, PCR: NEGATIVE
Staphylococcus aureus: POSITIVE — AB

## 2020-10-17 LAB — HEMOGLOBIN A1C
Hgb A1c MFr Bld: 7.4 % — ABNORMAL HIGH (ref 4.8–5.6)
Mean Plasma Glucose: 165.68 mg/dL

## 2020-10-17 LAB — GLUCOSE, CAPILLARY: Glucose-Capillary: 175 mg/dL — ABNORMAL HIGH (ref 70–99)

## 2020-10-17 NOTE — Progress Notes (Signed)
COVID Vaccine Completed:Yes Date COVID Vaccine completed:11/07/19- booster 06/19/20 COVID vaccine manufacturer: Pfizer      PCP - Dr. Earney Mallet Cardiologist - Dr. Estevan Ryder  Chest x-ray - 03/18/20-epic EKG - 05/16/20-epic Stress Test - no ECHO - 05/28/20-epic Cardiac Cath - no Pacemaker/ICD device last checked:NA  Sleep Study - yes CPAP - yes  Fasting Blood Sugar - 140-160 Checks Blood Sugar _____ times a day 2-3 times a week  Blood Thinner Instructions:ASA/ Dr. Kenton Kingfisher Aspirin Instructions:stop 7 days prior to [employer] Last Dose:10/10/20  Anesthesia review:   Patient denies shortness of breath, fever, cough and chest pain at PAT appointment yes  Patient verbalized understanding of instructions that were given to them at the PAT appointment. Patient was also instructed that they will need to review over the PAT instructions again at home before surgery. Yes Pt is fit and active no SOB with any activities. He has CP in 03/2020 cardiac work up was WNL. It may have been stress or anxiety.

## 2020-10-21 NOTE — Anesthesia Preprocedure Evaluation (Addendum)
Anesthesia Evaluation  Patient identified by MRN, date of birth, ID band Patient awake    Reviewed: Allergy & Precautions, NPO status , Patient's Chart, lab work & pertinent test results  Airway Mallampati: II  TM Distance: >3 FB Neck ROM: Full    Dental  (+) Teeth Intact, Dental Advisory Given   Pulmonary sleep apnea and Continuous Positive Airway Pressure Ventilation , former smoker,    breath sounds clear to auscultation       Cardiovascular hypertension, + angina  Rhythm:Regular Rate:Normal     Neuro/Psych negative neurological ROS  negative psych ROS   GI/Hepatic Neg liver ROS, GERD  ,  Endo/Other  diabetes  Renal/GU negative Renal ROS     Musculoskeletal  (+) Arthritis ,   Abdominal Normal abdominal exam  (+)   Peds  Hematology negative hematology ROS (+)   Anesthesia Other Findings - HLD  Reproductive/Obstetrics                           Anesthesia Physical Anesthesia Plan  ASA: III  Anesthesia Plan: Spinal   Post-op Pain Management:  Regional for Post-op pain   Induction: Intravenous  PONV Risk Score and Plan: 2 and Ondansetron, Propofol infusion, Dexamethasone and Midazolam  Airway Management Planned: Natural Airway and Simple Face Mask  Additional Equipment: None  Intra-op Plan:   Post-operative Plan:   Informed Consent: I have reviewed the patients History and Physical, chart, labs and discussed the procedure including the risks, benefits and alternatives for the proposed anesthesia with the patient or authorized representative who has indicated his/her understanding and acceptance.     Dental advisory given  Plan Discussed with: CRNA  Anesthesia Plan Comments: (Per cardiology note 08/24/2020, "Chart reviewed as part of pre-operative protocol coverage. Patient was contacted 08/24/2020 in reference to pre-operative risk assessment for pending surgery as outlined  below.  ULYSES PANICO was last seen on 07/16/2020 by Dr. Angelena Form.  Since that day, CARDIN NITSCHKE has done well without chest pain or shortness of breath.  Recent echocardiogram and coronary CT results are quite reassuring.  Given his moderate coronary artery disease, we would recommend continue aspirin through the surgery if possible, however if absolutely needed, may come off aspirin for 5 to 7 days prior to the surgery and restart as soon as possible afterward.  Therefore, based on ACC/AHA guidelines, the patient would be at acceptable risk for the planned procedure without further cardiovascular testing."  Lab Results      Component                Value               Date                      WBC                      13.0 (H)            10/17/2020                HGB                      15.8                10/17/2020                HCT  48.5                10/17/2020                MCV                      90.1                10/17/2020                PLT                      364                 10/17/2020           )       Anesthesia Quick Evaluation

## 2020-10-23 ENCOUNTER — Other Ambulatory Visit (HOSPITAL_COMMUNITY)
Admission: RE | Admit: 2020-10-23 | Discharge: 2020-10-23 | Disposition: A | Discharge status: Home / Self Care | Payer: Medicare Other | Source: Ambulatory Visit | Attending: Orthopedic Surgery | Admitting: Orthopedic Surgery

## 2020-10-23 DIAGNOSIS — Z20822 Contact with and (suspected) exposure to covid-19: Secondary | ICD-10-CM | POA: Diagnosis not present

## 2020-10-23 DIAGNOSIS — Z01812 Encounter for preprocedural laboratory examination: Secondary | ICD-10-CM | POA: Insufficient documentation

## 2020-10-23 LAB — SARS CORONAVIRUS 2 (TAT 6-24 HRS): SARS Coronavirus 2: NEGATIVE

## 2020-10-24 DIAGNOSIS — G4733 Obstructive sleep apnea (adult) (pediatric): Secondary | ICD-10-CM | POA: Diagnosis not present

## 2020-10-26 MED ORDER — BUPIVACAINE LIPOSOME 1.3 % IJ SUSP
20.0000 mL | Freq: Once | INTRAMUSCULAR | Status: DC
  Filled 2020-10-26: qty 20

## 2020-10-27 ENCOUNTER — Ambulatory Visit (HOSPITAL_COMMUNITY)
Admission: RE | Admit: 2020-10-27 | Discharge: 2020-10-27 | Disposition: A | Discharge status: Home / Self Care | Payer: Medicare Other | Source: Ambulatory Visit | Attending: Orthopedic Surgery | Admitting: Orthopedic Surgery

## 2020-10-27 ENCOUNTER — Encounter (HOSPITAL_COMMUNITY)
Admission: RE | Disposition: A | Discharge status: Home / Self Care | Payer: Self-pay | Source: Ambulatory Visit | Attending: Orthopedic Surgery

## 2020-10-27 ENCOUNTER — Ambulatory Visit (HOSPITAL_COMMUNITY): Payer: Medicare Other | Admitting: Physician Assistant

## 2020-10-27 ENCOUNTER — Ambulatory Visit (HOSPITAL_COMMUNITY): Payer: Medicare Other | Admitting: Certified Registered Nurse Anesthetist

## 2020-10-27 ENCOUNTER — Encounter (HOSPITAL_COMMUNITY): Payer: Self-pay | Admitting: Orthopedic Surgery

## 2020-10-27 DIAGNOSIS — E119 Type 2 diabetes mellitus without complications: Secondary | ICD-10-CM | POA: Diagnosis not present

## 2020-10-27 DIAGNOSIS — Z96651 Presence of right artificial knee joint: Secondary | ICD-10-CM | POA: Insufficient documentation

## 2020-10-27 DIAGNOSIS — G8918 Other acute postprocedural pain: Secondary | ICD-10-CM | POA: Diagnosis not present

## 2020-10-27 DIAGNOSIS — Z823 Family history of stroke: Secondary | ICD-10-CM | POA: Insufficient documentation

## 2020-10-27 DIAGNOSIS — I10 Essential (primary) hypertension: Secondary | ICD-10-CM | POA: Diagnosis not present

## 2020-10-27 DIAGNOSIS — Z87891 Personal history of nicotine dependence: Secondary | ICD-10-CM | POA: Diagnosis not present

## 2020-10-27 DIAGNOSIS — Z8249 Family history of ischemic heart disease and other diseases of the circulatory system: Secondary | ICD-10-CM | POA: Diagnosis not present

## 2020-10-27 DIAGNOSIS — M1712 Unilateral primary osteoarthritis, left knee: Secondary | ICD-10-CM | POA: Diagnosis not present

## 2020-10-27 DIAGNOSIS — Z882 Allergy status to sulfonamides status: Secondary | ICD-10-CM | POA: Insufficient documentation

## 2020-10-27 HISTORY — PX: PARTIAL KNEE ARTHROPLASTY: SHX2174

## 2020-10-27 LAB — GLUCOSE, CAPILLARY
Glucose-Capillary: 137 mg/dL — ABNORMAL HIGH (ref 70–99)
Glucose-Capillary: 142 mg/dL — ABNORMAL HIGH (ref 70–99)

## 2020-10-27 SURGERY — ARTHROPLASTY, KNEE, UNICOMPARTMENTAL
Anesthesia: Spinal | Site: Knee | Laterality: Left

## 2020-10-27 MED ORDER — PROPOFOL 500 MG/50ML IV EMUL
INTRAVENOUS | Status: AC
  Filled 2020-10-27: qty 100

## 2020-10-27 MED ORDER — BUPIVACAINE IN DEXTROSE 0.75-8.25 % IT SOLN
INTRATHECAL | Status: DC | PRN
Start: 2020-10-27 — End: 2020-10-27
  Administered 2020-10-27: 1.6 mL via INTRATHECAL

## 2020-10-27 MED ORDER — DEXAMETHASONE SODIUM PHOSPHATE 10 MG/ML IJ SOLN: 8.0000 mg | Freq: Once | INTRAMUSCULAR | Status: DC

## 2020-10-27 MED ORDER — ONDANSETRON HCL 4 MG/2ML IJ SOLN
INTRAMUSCULAR | Status: DC | PRN
  Administered 2020-10-27: 4 mg via INTRAVENOUS

## 2020-10-27 MED ORDER — ACETAMINOPHEN 10 MG/ML IV SOLN: 1000.0000 mg | Freq: Once | INTRAVENOUS | Status: DC | PRN

## 2020-10-27 MED ORDER — SODIUM CHLORIDE 0.9% FLUSH
INTRAVENOUS | Status: DC | PRN
  Administered 2020-10-27: 20 mL

## 2020-10-27 MED ORDER — PROPOFOL 500 MG/50ML IV EMUL
INTRAVENOUS | Status: DC | PRN
  Administered 2020-10-27: 80 ug/kg/min via INTRAVENOUS

## 2020-10-27 MED ORDER — SODIUM CHLORIDE 0.9 % IR SOLN
Status: DC | PRN
  Administered 2020-10-27: 1000 mL

## 2020-10-27 MED ORDER — BUPIVACAINE-EPINEPHRINE (PF) 0.5% -1:200000 IJ SOLN
INTRAMUSCULAR | Status: DC | PRN
  Administered 2020-10-27: 10 mL via PERINEURAL

## 2020-10-27 MED ORDER — ASPIRIN EC 325 MG PO TBEC: 325.0000 mg | DELAYED_RELEASE_TABLET | Freq: Two times a day (BID) | ORAL | 0 refills | Status: AC

## 2020-10-27 MED ORDER — MIDAZOLAM HCL 2 MG/2ML IJ SOLN
1.0000 mg | INTRAMUSCULAR | Status: DC
  Filled 2020-10-27: qty 2

## 2020-10-27 MED ORDER — GABAPENTIN 300 MG PO CAPS
300.0000 mg | ORAL_CAPSULE | Freq: Once | ORAL | Status: AC
  Administered 2020-10-27: 300 mg via ORAL
  Filled 2020-10-27: qty 1

## 2020-10-27 MED ORDER — STERILE WATER FOR IRRIGATION IR SOLN
Status: DC | PRN
  Administered 2020-10-27: 1000 mL

## 2020-10-27 MED ORDER — PROPOFOL 10 MG/ML IV BOLUS
INTRAVENOUS | Status: DC | PRN
  Administered 2020-10-27 (×3): 20 mg via INTRAVENOUS

## 2020-10-27 MED ORDER — METHOCARBAMOL 500 MG PO TABS: 500.0000 mg | ORAL_TABLET | Freq: Four times a day (QID) | ORAL | 0 refills | Status: DC

## 2020-10-27 MED ORDER — 0.9 % SODIUM CHLORIDE (POUR BTL) OPTIME
TOPICAL | Status: DC | PRN
  Administered 2020-10-27: 1000 mL

## 2020-10-27 MED ORDER — CEFAZOLIN SODIUM-DEXTROSE 2-4 GM/100ML-% IV SOLN
2.0000 g | INTRAVENOUS | Status: AC
  Administered 2020-10-27: 2 g via INTRAVENOUS
  Filled 2020-10-27: qty 100

## 2020-10-27 MED ORDER — TRANEXAMIC ACID-NACL 1000-0.7 MG/100ML-% IV SOLN
1000.0000 mg | INTRAVENOUS | Status: AC
  Administered 2020-10-27: 1000 mg via INTRAVENOUS
  Filled 2020-10-27: qty 100

## 2020-10-27 MED ORDER — SODIUM CHLORIDE (PF) 0.9 % IJ SOLN
INTRAMUSCULAR | Status: AC
  Filled 2020-10-27: qty 10

## 2020-10-27 MED ORDER — ACETAMINOPHEN 500 MG PO TABS
1000.0000 mg | ORAL_TABLET | Freq: Once | ORAL | Status: AC
  Administered 2020-10-27: 1000 mg via ORAL
  Filled 2020-10-27: qty 2

## 2020-10-27 MED ORDER — LACTATED RINGERS IV SOLN: INTRAVENOUS | Status: DC

## 2020-10-27 MED ORDER — ORAL CARE MOUTH RINSE: 15.0000 mL | Freq: Once | OROMUCOSAL | Status: AC

## 2020-10-27 MED ORDER — MEPERIDINE HCL 50 MG/ML IJ SOLN
6.2500 mg | INTRAMUSCULAR | Status: DC | PRN
Start: 2020-10-27 — End: 2020-10-27

## 2020-10-27 MED ORDER — ACETAMINOPHEN 325 MG PO TABS
325.0000 mg | ORAL_TABLET | Freq: Once | ORAL | Status: DC | PRN
Start: 2020-10-27 — End: 2020-10-27

## 2020-10-27 MED ORDER — AMISULPRIDE (ANTIEMETIC) 5 MG/2ML IV SOLN: 10.0000 mg | Freq: Once | INTRAVENOUS | Status: DC | PRN

## 2020-10-27 MED ORDER — HYDROMORPHONE HCL 1 MG/ML IJ SOLN: 0.2500 mg | INTRAMUSCULAR | Status: DC | PRN

## 2020-10-27 MED ORDER — CHLORHEXIDINE GLUCONATE 0.12 % MT SOLN
15.0000 mL | Freq: Once | OROMUCOSAL | Status: AC
  Administered 2020-10-27: 15 mL via OROMUCOSAL

## 2020-10-27 MED ORDER — OXYCODONE HCL 5 MG PO TABS: 5.0000 mg | ORAL_TABLET | Freq: Four times a day (QID) | ORAL | 0 refills | Status: DC | PRN

## 2020-10-27 MED ORDER — BUPIVACAINE-EPINEPHRINE 0.25% -1:200000 IJ SOLN
INTRAMUSCULAR | Status: DC | PRN
  Administered 2020-10-27: 30 mL

## 2020-10-27 MED ORDER — DEXAMETHASONE SODIUM PHOSPHATE 10 MG/ML IJ SOLN
INTRAMUSCULAR | Status: DC | PRN
  Administered 2020-10-27: 5 mg via INTRAVENOUS

## 2020-10-27 MED ORDER — BUPIVACAINE-EPINEPHRINE (PF) 0.25% -1:200000 IJ SOLN
INTRAMUSCULAR | Status: AC
  Filled 2020-10-27: qty 30

## 2020-10-27 MED ORDER — BUPIVACAINE-MELOXICAM ER 400-12 MG/14ML IJ SOLN
INTRAMUSCULAR | Status: AC
  Filled 2020-10-27: qty 1

## 2020-10-27 MED ORDER — GABAPENTIN 300 MG PO CAPS: 300.0000 mg | ORAL_CAPSULE | Freq: Three times a day (TID) | ORAL | 0 refills | Status: DC

## 2020-10-27 MED ORDER — ACETAMINOPHEN 160 MG/5ML PO SOLN: 325.0000 mg | Freq: Once | ORAL | Status: DC | PRN

## 2020-10-27 MED ORDER — FENTANYL CITRATE (PF) 100 MCG/2ML IJ SOLN
50.0000 ug | INTRAMUSCULAR | Status: DC
  Filled 2020-10-27: qty 2

## 2020-10-27 MED ORDER — BUPIVACAINE LIPOSOME 1.3 % IJ SUSP
INTRAMUSCULAR | Status: DC | PRN
  Administered 2020-10-27: 20 mL

## 2020-10-27 MED ORDER — POVIDONE-IODINE 10 % EX SWAB
2.0000 "application " | Freq: Once | CUTANEOUS | Status: AC
  Administered 2020-10-27: 2 via TOPICAL

## 2020-10-27 SURGICAL SUPPLY — 52 items
BAG SPEC THK2 15X12 ZIP CLS (MISCELLANEOUS) ×1
BAG ZIPLOCK 12X15 (MISCELLANEOUS) ×2 IMPLANT
BLADE SAW RECIPROCATING 77.5 (BLADE) ×2 IMPLANT
BLADE SAW SAG 90X13X1.27 (BLADE) ×2 IMPLANT
BNDG CMPR MED 10X6 ELC LF (GAUZE/BANDAGES/DRESSINGS) ×1
BNDG ELASTIC 6X10 VLCR STRL LF (GAUZE/BANDAGES/DRESSINGS) ×1 IMPLANT
BNDG ELASTIC 6X5.8 VLCR STR LF (GAUZE/BANDAGES/DRESSINGS) ×2 IMPLANT
BOWL SMART MIX CTS (DISPOSABLE) ×2 IMPLANT
BRNG TIB 3 9 UNCMP STRL KN (Orthopedic Implant) ×1 IMPLANT
CEMENT BONE SIMPLEX SPEEDSET (Cement) ×3 IMPLANT
CLSR STERI-STRIP ANTIMIC 1/2X4 (GAUZE/BANDAGES/DRESSINGS) ×1 IMPLANT
COVER SURGICAL LIGHT HANDLE (MISCELLANEOUS) ×2 IMPLANT
COVER WAND RF STERILE (DRAPES) IMPLANT
CUFF TOURN SGL QUICK 34 (TOURNIQUET CUFF) ×2
CUFF TRNQT CYL 34X4.125X (TOURNIQUET CUFF) ×1 IMPLANT
DECANTER SPIKE VIAL GLASS SM (MISCELLANEOUS) IMPLANT
DRAPE INCISE IOBAN 66X45 STRL (DRAPES) ×4 IMPLANT
DRESSING AQUACEL AG SP 3.5X10 (GAUZE/BANDAGES/DRESSINGS) IMPLANT
DRSG AQUACEL AG ADV 3.5X 6 (GAUZE/BANDAGES/DRESSINGS) ×2 IMPLANT
DRSG AQUACEL AG SP 3.5X10 (GAUZE/BANDAGES/DRESSINGS) ×2
DURAPREP 26ML APPLICATOR (WOUND CARE) ×2 IMPLANT
ELECT REM PT RETURN 15FT ADLT (MISCELLANEOUS) ×2 IMPLANT
FEMORAL IBALANCE LT UKA SZ4 (Knees) ×1 IMPLANT
GLOVE BIOGEL M 7.0 STRL (GLOVE) ×2 IMPLANT
GLOVE SURG ORTHO LTX SZ8 (GLOVE) ×6 IMPLANT
GLOVE SURG UNDER POLY LF SZ7.5 (GLOVE) ×2 IMPLANT
GLOVE SURG UNDER POLY LF SZ8.5 (GLOVE) ×4 IMPLANT
GOWN STRL REUS W/TWL 2XL LVL3 (GOWN DISPOSABLE) ×2 IMPLANT
GOWN STRL REUS W/TWL XL LVL3 (GOWN DISPOSABLE) ×4 IMPLANT
GUIDE UKA TIBIAL DISPOSEABLE (DISPOSABLE) ×1 IMPLANT
HANDPIECE INTERPULSE COAX TIP (DISPOSABLE) ×2
HOLDER FOLEY CATH W/STRAP (MISCELLANEOUS) IMPLANT
HOOD PEEL AWAY FLYTE STAYCOOL (MISCELLANEOUS) ×6 IMPLANT
IMPL TIBIA BEARING SZ3 KNEE (Orthopedic Implant) IMPLANT
IMPLANT TIBIA BEARING SZ3 KNEE (Orthopedic Implant) ×2 IMPLANT
KIT TURNOVER KIT A (KITS) ×2 IMPLANT
MANIFOLD NEPTUNE II (INSTRUMENTS) ×2 IMPLANT
NS IRRIG 1000ML POUR BTL (IV SOLUTION) ×2 IMPLANT
PACK TOTAL KNEE CUSTOM (KITS) ×2 IMPLANT
PENCIL SMOKE EVACUATOR (MISCELLANEOUS) IMPLANT
SET HNDPC FAN SPRY TIP SCT (DISPOSABLE) ×1 IMPLANT
STRIP CLOSURE SKIN 1/2X4 (GAUZE/BANDAGES/DRESSINGS) ×2 IMPLANT
SUT MNCRL AB 4-0 PS2 18 (SUTURE) ×2 IMPLANT
SUT STRATAFIX 0 PDS 27 VIOLET (SUTURE) ×2
SUT STRATAFIX PDS+ 0 24IN (SUTURE) ×2 IMPLANT
SUT VIC AB 1 CT1 36 (SUTURE) ×2 IMPLANT
SUTURE STRATFX 0 PDS 27 VIOLET (SUTURE) ×1 IMPLANT
SYR CONTROL 10ML LL (SYRINGE) ×6 IMPLANT
TRAY FOLEY MTR SLVR 16FR STAT (SET/KITS/TRAYS/PACK) ×2 IMPLANT
TRAY TIB UKA CMNTD SZ3 LM/RL (Miscellaneous) ×1 IMPLANT
WATER STERILE IRR 1000ML POUR (IV SOLUTION) ×2 IMPLANT
WRAP KNEE MAXI GEL POST OP (GAUZE/BANDAGES/DRESSINGS) ×1 IMPLANT

## 2020-10-27 NOTE — Progress Notes (Signed)
Orthopedic Tech Progress Note Patient Details:  Gary Woodward Apr 03, 1951 [id number]  Patient ID: Gary Woodward, male   DOB: 19-Jul-1951, 70 y.o.   MRN: [id number]   [name] 10/27/2020, 11:35 AM Bone foam placed on pt in PACU

## 2020-10-27 NOTE — Progress Notes (Signed)
Assisted Dr. Hollis with left, ultrasound guided, adductor canal block. Side rails up, monitors on throughout procedure. See vital signs in flow sheet. Tolerated Procedure well.  

## 2020-10-27 NOTE — Anesthesia Procedure Notes (Signed)
Anesthesia Regional Block: Adductor canal block   Pre-Anesthetic Checklist: ,, timeout performed, Correct Patient, Correct Site, Correct Laterality, Correct Procedure, Correct Position, site marked, Risks and benefits discussed,  Surgical consent,  Pre-op evaluation,  At surgeon's request and post-op pain management  Laterality: Left  Prep: chloraprep       Needles:  Injection technique: Single-shot  Needle Type: Echogenic Stimulator Needle     Needle Length: 9cm  Needle Gauge: 21     Additional Needles:   Procedures:,,,, ultrasound used (permanent image in chart),,,,  Narrative:  Start time: 10/27/2020 8:31 AM End time: 10/27/2020 8:36 AM Injection made incrementally with aspirations every 5 mL.  Performed by: Personally  Anesthesiologist: Effie Berkshire, MD  Additional Notes: Patient tolerated the procedure well. Local anesthetic introduced in an incremental fashion under minimal resistance after negative aspirations. No paresthesias were elicited. After completion of the procedure, no acute issues were identified and patient continued to be monitored by RN.

## 2020-10-27 NOTE — Discharge Instructions (Signed)

## 2020-10-27 NOTE — Anesthesia Postprocedure Evaluation (Signed)
Anesthesia Post Note  Patient: Gary Woodward  Procedure(s) Performed: UNICOMPARTMENTAL KNEE (Left Knee)     Patient location during evaluation: PACU Anesthesia Type: Spinal Level of consciousness: oriented and awake and alert Pain management: pain level controlled Vital Signs Assessment: post-procedure vital signs reviewed and stable Respiratory status: spontaneous breathing, respiratory function stable and patient connected to nasal cannula oxygen Cardiovascular status: blood pressure returned to baseline and stable Postop Assessment: no headache, no backache, no apparent nausea or vomiting and spinal receding Anesthetic complications: no   No complications documented.  Last Vitals:  Vitals:   10/27/20 1245 10/27/20 1315  BP: 140/78 133/81  Pulse: (!) 55   Resp: 12 16  Temp:    SpO2: 98% 98%    Last Pain:  Vitals:   10/27/20 1315  PainSc: 0-No pain                 Effie Berkshire

## 2020-10-27 NOTE — Op Note (Signed)
UNI KNEE REPLACEMENT OPERATIVE NOTE:  10/27/2020  11:21 AM  PATIENT:  Gary Woodward  70 y.o. male  PRE-OPERATIVE DIAGNOSIS:  Osteoarthritis of left knee M17.12  POST-OPERATIVE DIAGNOSIS:  Osteoarthritis of left knee M17.12  PROCEDURE:  Procedure(s): UNICOMPARTMENTAL KNEE  SURGEON:  Surgeon(s): Vickey Huger, MD  PHYSICIAN ASSISTANT: Carlyon Shadow PA-C  ANESTHESIA:   spinal  DRAINS: Hemovac  SPECIMEN: None  Woodward:  Correct  TOURNIQUET:   Total Tourniquet Time Documented: Thigh (Left) - 41 minutes Total: Thigh (Left) - 41 minutes   DICTATION:  Indication for procedure:    The patient is a 70 y.o. male who has failed conservative treatment for Osteoarthritis of left knee M17.12.  Informed consent was obtained prior to anesthesia. The risks versus benefits of the operation were explain and in a way the patient can, and did, understand.   Description of procedure:     The patient was taken to the operating room and placed under anesthesia.  The patient was positioned in the usual fashion taking care that all body parts were adequately padded and/or protected.  I foley catheter was placed.  A tourniquet was applied and the leg prepped and draped in the usual sterile fashion.  The extremity was exsanguinated with the esmarch and tourniquet inflated to 300 mmHg.  Pre-operative range of motion was normal.    A midline incision approximately 3-4 inches long was made with a #10 blade.  A new blade was used to make a parapatellar arthrotomy going 1 cm into the quadriceps tendon, over the patella, and alongside the medial aspect of the patellar tendon.  A synovectomy was then performed with the #10 blade and forceps. I then elevated the deep MCL off the medial tibial flare. The knee was put at 90 degrees and the patient specific cutting blocks were used to make our proximal tibial cut and distal femoral cut. The medial meniscus was removed at this point.  I then used the 4 cutting  guide on the femur to drill for lugs and cut the chamfers. Likewise, a 3 tibial baseplate was used to prepare the tibia. I then trialed the  4 femur and 3 tibia. I trialed several poly inserts and a 9 mm achieved good balance in flexion and extension.  I then irrigated copiously and then mixed the cement. I injected exparel in the deep soft tissues at this point. I then cemented the tibia first followed by the femur and removed excess cement and then inserted the polyethylene. I placed the leg in extension and finished injecting the rest of the exparel. Once the cement was hard, the tourniquet was let down. Hemostasis was obtained. The arthrotomy was closed using a #1 stratofix running suture.  The skin was reapproximated and closed using a running 3.0 monocryl. The wound was covered with steristrips, aquacel dressing, and a TED stocking. The patient was awakened, extubated, and taken to recovery in a stable condition.   BLOOD LOSS:  076AU COMPLICATIONS:  None.  PLAN OF CARE: Discharge to home after PACU  PATIENT DISPOSITION:  PACU - hemodynamically stable.    Please fax a copy of this op note to my office at [phone] (please only include page 1 and 2 of the Case Information op note)

## 2020-10-27 NOTE — H&P (Signed)
FITZ MATSUO MRN:  [id number] DOB/SEX:  1951-08-05/male  CHIEF COMPLAINT:  Painful left Knee  HISTORY: Patient is a 70 y.o. male presented with a history of pain in the left knee. Onset of symptoms was gradual starting a few years ago with gradually worsening course since that time. Patient has been treated conservatively with over-the-counter NSAIDs and activity modification. Patient currently rates pain in the knee at 10 out of 10 with activity. There is pain at night.  PAST MEDICAL HISTORY: There are no problems to display for this patient.  Past Medical History:  Diagnosis Date  . Anginal pain ([place]) 03/2020   anxiety  . Arthritis    knees  . Diabetes mellitus without complication ([place])   . GERD (gastroesophageal reflux disease)   . Hyperlipidemia   . Hypertension   . Sleep apnea    c-pap   Past Surgical History:  Procedure Laterality Date  . KNEE ARTHROSCOPY Bilateral 1990-97   x 3  . right knee replacement    . SPLENECTOMY  1966  . TONSILLECTOMY  1995     MEDICATIONS:   No medications prior to admission.    ALLERGIES:   Allergies  Allergen Reactions  . Sulfa Antibiotics     Unknown reaction    REVIEW OF SYSTEMS:  A comprehensive review of systems was negative except for: Musculoskeletal: positive for arthralgias and bone pain   FAMILY HISTORY:   Family History  Problem Relation Age of Onset  . Heart attack Mother 24  . Stroke Father     SOCIAL HISTORY:   Social History   Tobacco Use  . Smoking status: Former Smoker    Packs/day: 1.00    Years: 20.00    Pack years: 20.00    Types: Cigarettes    Quit date: 09/06/1988    Years since quitting: 32.1  . Smokeless tobacco: Never Used  Substance Use Topics  . Alcohol use: Yes    Alcohol/week: 7.0 standard drinks    Types: 7 Standard drinks or equivalent per week     EXAMINATION:  Vital signs in last 24 hours:    There were no vitals taken for this visit.  General Appearance:    Alert,  cooperative, no distress, appears stated age  Head:    Normocephalic, without obvious abnormality, atraumatic  Eyes:    PERRL, conjunctiva/corneas clear, EOM's intact, fundi    benign, both eyes       Ears:    Normal TM's and external ear canals, both ears  Nose:   Nares normal, septum midline, mucosa normal, no drainage    or sinus tenderness  Throat:   Lips, mucosa, and tongue normal; teeth and gums normal  Neck:   Supple, symmetrical, trachea midline, no adenopathy;       thyroid:  No enlargement/tenderness/nodules; no carotid   bruit or JVD  Back:     Symmetric, no curvature, ROM normal, no CVA tenderness  Lungs:     Clear to auscultation bilaterally, respirations unlabored  Chest wall:    No tenderness or deformity  Heart:    Regular rate and rhythm, S1 and S2 normal, no murmur, rub   or gallop  Abdomen:     Soft, non-tender, bowel sounds active all four quadrants,    no masses, no organomegaly  Genitalia:    Normal male without lesion, discharge or tenderness  Rectal:    Normal tone, normal prostate, no masses or tenderness;   guaiac negative stool  Extremities:  Extremities normal, atraumatic, no cyanosis or edema  Pulses:   2+ and symmetric all extremities  Skin:   Skin color, texture, turgor normal, no rashes or lesions  Lymph nodes:   Cervical, supraclavicular, and axillary nodes normal  Neurologic:   CNII-XII intact. Normal strength, sensation and reflexes      throughout    Musculoskeletal:  ROM 0-120, Ligaments intact,  Imaging Review Plain radiographs demonstrate severe degenerative joint disease of the left knee. The overall alignment is mild varus. The bone quality appears to be good for age and reported activity level.  Assessment/Plan: Primary osteoarthritis, left knee   The patient history, physical examination and imaging studies are consistent with advanced degenerative joint disease of the left knee. The patient has failed conservative treatment.  The  clearance notes were reviewed.  After discussion with the patient it was felt that Partial Knee Replacement was indicated. The procedure,  risks, and benefits of partial knee arthroplasty were presented and reviewed. The risks including but not limited to aseptic loosening, infection, blood clots, vascular injury, stiffness, patella tracking problems complications among others were discussed. The patient acknowledged the explanation, agreed to proceed with the plan.  Preoperative templating of the joint replacement has been completed, documented, and submitted to the Operating Room personnel in order to optimize intra-operative equipment management.    Patient's anticipated LOS is less than 2 midnights, meeting these requirements: - Lives within 1 hour of care - Has a competent adult at home to recover with post-op recover - NO history of  - Chronic pain requiring opiods  - Diabetes  - Coronary Artery Disease  - Heart failure  - Heart attack  - Stroke  - DVT/VTE  - Cardiac arrhythmia  - Respiratory Failure/COPD  - Renal failure  - Anemia  - Advanced Liver disease       Donia Ast 10/27/2020, 6:59 AM

## 2020-10-27 NOTE — Anesthesia Procedure Notes (Signed)
Spinal  Start time: 10/27/2020 9:35 AM End time: 10/27/2020 9:37 AM Staffing Performed: anesthesiologist  Anesthesiologist: Effie Berkshire, MD Preanesthetic Checklist Completed: patient identified, IV checked, site marked, risks and benefits discussed, surgical consent, monitors and equipment checked, pre-op evaluation and timeout performed Spinal Block Patient position: sitting Prep: DuraPrep and site prepped and draped Location: L3-4 Injection technique: single-shot Needle Needle type: Pencan  Needle gauge: 24 G Needle length: 10 cm Needle insertion depth: 10 cm Additional Notes Patient tolerated well. No immediate complications.

## 2020-10-27 NOTE — Transfer of Care (Signed)
Immediate Anesthesia Transfer of Care Note  Patient: Gary Woodward  Procedure(s) Performed: UNICOMPARTMENTAL KNEE (Left Knee)  Patient Location: PACU  Anesthesia Type:Spinal  Level of Consciousness: awake, alert , oriented and patient cooperative  Airway & Oxygen Therapy: Patient Spontanous Breathing and Patient connected to face mask oxygen  Post-op Assessment: Report given to RN and Post -op Vital signs reviewed and stable  Post vital signs: Reviewed and stable  Last Vitals:  Vitals Value Taken Time  BP 119/72 10/27/20 1108  Temp    Pulse 64 10/27/20 1109  Resp 14 10/27/20 1109  SpO2 100 % 10/27/20 1109  Vitals shown include unvalidated device data.  Last Pain:  Vitals:   10/27/20 0731  PainSc: 0-No pain         Complications: No complications documented.

## 2020-11-03 DIAGNOSIS — Z96652 Presence of left artificial knee joint: Secondary | ICD-10-CM | POA: Diagnosis not present

## 2020-11-03 DIAGNOSIS — M25662 Stiffness of left knee, not elsewhere classified: Secondary | ICD-10-CM | POA: Diagnosis not present

## 2020-11-03 DIAGNOSIS — M25562 Pain in left knee: Secondary | ICD-10-CM | POA: Diagnosis not present

## 2020-11-03 DIAGNOSIS — M25462 Effusion, left knee: Secondary | ICD-10-CM | POA: Diagnosis not present

## 2020-11-03 DIAGNOSIS — Z7409 Other reduced mobility: Secondary | ICD-10-CM | POA: Diagnosis not present

## 2020-11-04 ENCOUNTER — Encounter (HOSPITAL_COMMUNITY): Payer: Self-pay | Admitting: Orthopedic Surgery

## 2020-11-04 DIAGNOSIS — Z96652 Presence of left artificial knee joint: Secondary | ICD-10-CM | POA: Diagnosis not present

## 2020-11-06 DIAGNOSIS — M25462 Effusion, left knee: Secondary | ICD-10-CM | POA: Diagnosis not present

## 2020-11-06 DIAGNOSIS — M25562 Pain in left knee: Secondary | ICD-10-CM | POA: Diagnosis not present

## 2020-11-06 DIAGNOSIS — M25662 Stiffness of left knee, not elsewhere classified: Secondary | ICD-10-CM | POA: Diagnosis not present

## 2020-11-06 DIAGNOSIS — R29898 Other symptoms and signs involving the musculoskeletal system: Secondary | ICD-10-CM | POA: Diagnosis not present

## 2020-11-06 DIAGNOSIS — Z96652 Presence of left artificial knee joint: Secondary | ICD-10-CM | POA: Diagnosis not present

## 2020-11-06 DIAGNOSIS — Z7409 Other reduced mobility: Secondary | ICD-10-CM | POA: Diagnosis not present

## 2020-11-11 DIAGNOSIS — Z7409 Other reduced mobility: Secondary | ICD-10-CM | POA: Diagnosis not present

## 2020-11-11 DIAGNOSIS — Z96652 Presence of left artificial knee joint: Secondary | ICD-10-CM | POA: Diagnosis not present

## 2020-11-11 DIAGNOSIS — M25662 Stiffness of left knee, not elsewhere classified: Secondary | ICD-10-CM | POA: Diagnosis not present

## 2020-11-11 DIAGNOSIS — R29898 Other symptoms and signs involving the musculoskeletal system: Secondary | ICD-10-CM | POA: Diagnosis not present

## 2020-11-11 DIAGNOSIS — M25462 Effusion, left knee: Secondary | ICD-10-CM | POA: Diagnosis not present

## 2020-11-11 DIAGNOSIS — M25562 Pain in left knee: Secondary | ICD-10-CM | POA: Diagnosis not present

## 2020-11-13 DIAGNOSIS — M25562 Pain in left knee: Secondary | ICD-10-CM | POA: Diagnosis not present

## 2020-11-13 DIAGNOSIS — M25662 Stiffness of left knee, not elsewhere classified: Secondary | ICD-10-CM | POA: Diagnosis not present

## 2020-11-13 DIAGNOSIS — M25462 Effusion, left knee: Secondary | ICD-10-CM | POA: Diagnosis not present

## 2020-11-13 DIAGNOSIS — Z7409 Other reduced mobility: Secondary | ICD-10-CM | POA: Diagnosis not present

## 2020-11-13 DIAGNOSIS — Z96652 Presence of left artificial knee joint: Secondary | ICD-10-CM | POA: Diagnosis not present

## 2020-11-13 DIAGNOSIS — R29898 Other symptoms and signs involving the musculoskeletal system: Secondary | ICD-10-CM | POA: Diagnosis not present

## 2020-11-18 DIAGNOSIS — M25462 Effusion, left knee: Secondary | ICD-10-CM | POA: Diagnosis not present

## 2020-11-18 DIAGNOSIS — Z7409 Other reduced mobility: Secondary | ICD-10-CM | POA: Diagnosis not present

## 2020-11-18 DIAGNOSIS — R29898 Other symptoms and signs involving the musculoskeletal system: Secondary | ICD-10-CM | POA: Diagnosis not present

## 2020-11-18 DIAGNOSIS — Z96652 Presence of left artificial knee joint: Secondary | ICD-10-CM | POA: Diagnosis not present

## 2020-11-18 DIAGNOSIS — M25562 Pain in left knee: Secondary | ICD-10-CM | POA: Diagnosis not present

## 2020-11-18 DIAGNOSIS — M25662 Stiffness of left knee, not elsewhere classified: Secondary | ICD-10-CM | POA: Diagnosis not present

## 2020-11-20 DIAGNOSIS — R29898 Other symptoms and signs involving the musculoskeletal system: Secondary | ICD-10-CM | POA: Diagnosis not present

## 2020-11-20 DIAGNOSIS — M25562 Pain in left knee: Secondary | ICD-10-CM | POA: Diagnosis not present

## 2020-11-20 DIAGNOSIS — Z96652 Presence of left artificial knee joint: Secondary | ICD-10-CM | POA: Diagnosis not present

## 2020-11-20 DIAGNOSIS — Z7409 Other reduced mobility: Secondary | ICD-10-CM | POA: Diagnosis not present

## 2020-11-20 DIAGNOSIS — M25462 Effusion, left knee: Secondary | ICD-10-CM | POA: Diagnosis not present

## 2020-11-20 DIAGNOSIS — M25662 Stiffness of left knee, not elsewhere classified: Secondary | ICD-10-CM | POA: Diagnosis not present

## 2020-11-25 DIAGNOSIS — R29898 Other symptoms and signs involving the musculoskeletal system: Secondary | ICD-10-CM | POA: Diagnosis not present

## 2020-11-25 DIAGNOSIS — M25462 Effusion, left knee: Secondary | ICD-10-CM | POA: Diagnosis not present

## 2020-11-25 DIAGNOSIS — M25562 Pain in left knee: Secondary | ICD-10-CM | POA: Diagnosis not present

## 2020-11-25 DIAGNOSIS — Z7409 Other reduced mobility: Secondary | ICD-10-CM | POA: Diagnosis not present

## 2020-11-25 DIAGNOSIS — M25662 Stiffness of left knee, not elsewhere classified: Secondary | ICD-10-CM | POA: Diagnosis not present

## 2020-11-25 DIAGNOSIS — Z96652 Presence of left artificial knee joint: Secondary | ICD-10-CM | POA: Diagnosis not present

## 2020-11-27 DIAGNOSIS — M25662 Stiffness of left knee, not elsewhere classified: Secondary | ICD-10-CM | POA: Diagnosis not present

## 2020-11-27 DIAGNOSIS — Z7409 Other reduced mobility: Secondary | ICD-10-CM | POA: Diagnosis not present

## 2020-11-27 DIAGNOSIS — M25562 Pain in left knee: Secondary | ICD-10-CM | POA: Diagnosis not present

## 2020-11-27 DIAGNOSIS — Z96652 Presence of left artificial knee joint: Secondary | ICD-10-CM | POA: Diagnosis not present

## 2020-11-27 DIAGNOSIS — R29898 Other symptoms and signs involving the musculoskeletal system: Secondary | ICD-10-CM | POA: Diagnosis not present

## 2020-11-27 DIAGNOSIS — M25462 Effusion, left knee: Secondary | ICD-10-CM | POA: Diagnosis not present

## 2020-12-02 DIAGNOSIS — Z7409 Other reduced mobility: Secondary | ICD-10-CM | POA: Diagnosis not present

## 2020-12-02 DIAGNOSIS — M25562 Pain in left knee: Secondary | ICD-10-CM | POA: Diagnosis not present

## 2020-12-02 DIAGNOSIS — M25662 Stiffness of left knee, not elsewhere classified: Secondary | ICD-10-CM | POA: Diagnosis not present

## 2020-12-02 DIAGNOSIS — R29898 Other symptoms and signs involving the musculoskeletal system: Secondary | ICD-10-CM | POA: Diagnosis not present

## 2020-12-02 DIAGNOSIS — M25462 Effusion, left knee: Secondary | ICD-10-CM | POA: Diagnosis not present

## 2020-12-02 DIAGNOSIS — Z96652 Presence of left artificial knee joint: Secondary | ICD-10-CM | POA: Diagnosis not present

## 2020-12-04 DIAGNOSIS — M25562 Pain in left knee: Secondary | ICD-10-CM | POA: Diagnosis not present

## 2020-12-04 DIAGNOSIS — M25662 Stiffness of left knee, not elsewhere classified: Secondary | ICD-10-CM | POA: Diagnosis not present

## 2020-12-04 DIAGNOSIS — M25462 Effusion, left knee: Secondary | ICD-10-CM | POA: Diagnosis not present

## 2020-12-04 DIAGNOSIS — Z7409 Other reduced mobility: Secondary | ICD-10-CM | POA: Diagnosis not present

## 2020-12-04 DIAGNOSIS — R29898 Other symptoms and signs involving the musculoskeletal system: Secondary | ICD-10-CM | POA: Diagnosis not present

## 2020-12-04 DIAGNOSIS — Z96652 Presence of left artificial knee joint: Secondary | ICD-10-CM | POA: Diagnosis not present

## 2020-12-09 DIAGNOSIS — R29898 Other symptoms and signs involving the musculoskeletal system: Secondary | ICD-10-CM | POA: Diagnosis not present

## 2020-12-09 DIAGNOSIS — M25562 Pain in left knee: Secondary | ICD-10-CM | POA: Diagnosis not present

## 2020-12-09 DIAGNOSIS — M25662 Stiffness of left knee, not elsewhere classified: Secondary | ICD-10-CM | POA: Diagnosis not present

## 2020-12-09 DIAGNOSIS — Z7409 Other reduced mobility: Secondary | ICD-10-CM | POA: Diagnosis not present

## 2020-12-09 DIAGNOSIS — Z96652 Presence of left artificial knee joint: Secondary | ICD-10-CM | POA: Diagnosis not present

## 2020-12-09 DIAGNOSIS — M25462 Effusion, left knee: Secondary | ICD-10-CM | POA: Diagnosis not present

## 2020-12-11 DIAGNOSIS — M25662 Stiffness of left knee, not elsewhere classified: Secondary | ICD-10-CM | POA: Diagnosis not present

## 2020-12-11 DIAGNOSIS — Z96652 Presence of left artificial knee joint: Secondary | ICD-10-CM | POA: Diagnosis not present

## 2020-12-11 DIAGNOSIS — M25462 Effusion, left knee: Secondary | ICD-10-CM | POA: Diagnosis not present

## 2020-12-11 DIAGNOSIS — Z7409 Other reduced mobility: Secondary | ICD-10-CM | POA: Diagnosis not present

## 2020-12-11 DIAGNOSIS — M25562 Pain in left knee: Secondary | ICD-10-CM | POA: Diagnosis not present

## 2020-12-11 DIAGNOSIS — R29898 Other symptoms and signs involving the musculoskeletal system: Secondary | ICD-10-CM | POA: Diagnosis not present

## 2020-12-16 DIAGNOSIS — Z96652 Presence of left artificial knee joint: Secondary | ICD-10-CM | POA: Diagnosis not present

## 2020-12-16 DIAGNOSIS — M25562 Pain in left knee: Secondary | ICD-10-CM | POA: Diagnosis not present

## 2020-12-16 DIAGNOSIS — M25662 Stiffness of left knee, not elsewhere classified: Secondary | ICD-10-CM | POA: Diagnosis not present

## 2020-12-16 DIAGNOSIS — Z7409 Other reduced mobility: Secondary | ICD-10-CM | POA: Diagnosis not present

## 2020-12-16 DIAGNOSIS — R29898 Other symptoms and signs involving the musculoskeletal system: Secondary | ICD-10-CM | POA: Diagnosis not present

## 2020-12-16 DIAGNOSIS — M25462 Effusion, left knee: Secondary | ICD-10-CM | POA: Diagnosis not present

## 2020-12-18 DIAGNOSIS — M25662 Stiffness of left knee, not elsewhere classified: Secondary | ICD-10-CM | POA: Diagnosis not present

## 2020-12-18 DIAGNOSIS — Z96652 Presence of left artificial knee joint: Secondary | ICD-10-CM | POA: Diagnosis not present

## 2020-12-18 DIAGNOSIS — Z7409 Other reduced mobility: Secondary | ICD-10-CM | POA: Diagnosis not present

## 2020-12-18 DIAGNOSIS — R29898 Other symptoms and signs involving the musculoskeletal system: Secondary | ICD-10-CM | POA: Diagnosis not present

## 2020-12-18 DIAGNOSIS — M25462 Effusion, left knee: Secondary | ICD-10-CM | POA: Diagnosis not present

## 2020-12-18 DIAGNOSIS — M25562 Pain in left knee: Secondary | ICD-10-CM | POA: Diagnosis not present

## 2020-12-23 DIAGNOSIS — M25662 Stiffness of left knee, not elsewhere classified: Secondary | ICD-10-CM | POA: Diagnosis not present

## 2020-12-23 DIAGNOSIS — Z7409 Other reduced mobility: Secondary | ICD-10-CM | POA: Diagnosis not present

## 2020-12-23 DIAGNOSIS — M25562 Pain in left knee: Secondary | ICD-10-CM | POA: Diagnosis not present

## 2020-12-23 DIAGNOSIS — M25462 Effusion, left knee: Secondary | ICD-10-CM | POA: Diagnosis not present

## 2020-12-23 DIAGNOSIS — Z96652 Presence of left artificial knee joint: Secondary | ICD-10-CM | POA: Diagnosis not present

## 2020-12-23 DIAGNOSIS — R29898 Other symptoms and signs involving the musculoskeletal system: Secondary | ICD-10-CM | POA: Diagnosis not present

## 2020-12-25 DIAGNOSIS — M25662 Stiffness of left knee, not elsewhere classified: Secondary | ICD-10-CM | POA: Diagnosis not present

## 2020-12-25 DIAGNOSIS — Z96652 Presence of left artificial knee joint: Secondary | ICD-10-CM | POA: Diagnosis not present

## 2020-12-25 DIAGNOSIS — M25462 Effusion, left knee: Secondary | ICD-10-CM | POA: Diagnosis not present

## 2020-12-25 DIAGNOSIS — M25562 Pain in left knee: Secondary | ICD-10-CM | POA: Diagnosis not present

## 2020-12-25 DIAGNOSIS — Z7409 Other reduced mobility: Secondary | ICD-10-CM | POA: Diagnosis not present

## 2020-12-25 DIAGNOSIS — R29898 Other symptoms and signs involving the musculoskeletal system: Secondary | ICD-10-CM | POA: Diagnosis not present

## 2020-12-30 DIAGNOSIS — M25562 Pain in left knee: Secondary | ICD-10-CM | POA: Diagnosis not present

## 2020-12-30 DIAGNOSIS — R29898 Other symptoms and signs involving the musculoskeletal system: Secondary | ICD-10-CM | POA: Diagnosis not present

## 2020-12-30 DIAGNOSIS — Z96652 Presence of left artificial knee joint: Secondary | ICD-10-CM | POA: Diagnosis not present

## 2020-12-30 DIAGNOSIS — M25462 Effusion, left knee: Secondary | ICD-10-CM | POA: Diagnosis not present

## 2020-12-30 DIAGNOSIS — Z7409 Other reduced mobility: Secondary | ICD-10-CM | POA: Diagnosis not present

## 2020-12-30 DIAGNOSIS — M25662 Stiffness of left knee, not elsewhere classified: Secondary | ICD-10-CM | POA: Diagnosis not present

## 2020-12-30 DIAGNOSIS — Z96651 Presence of right artificial knee joint: Secondary | ICD-10-CM | POA: Diagnosis not present

## 2020-12-30 DIAGNOSIS — G8929 Other chronic pain: Secondary | ICD-10-CM | POA: Diagnosis not present

## 2021-01-01 DIAGNOSIS — Z96652 Presence of left artificial knee joint: Secondary | ICD-10-CM | POA: Diagnosis not present

## 2021-01-01 DIAGNOSIS — Z7409 Other reduced mobility: Secondary | ICD-10-CM | POA: Diagnosis not present

## 2021-01-01 DIAGNOSIS — M25662 Stiffness of left knee, not elsewhere classified: Secondary | ICD-10-CM | POA: Diagnosis not present

## 2021-01-01 DIAGNOSIS — M25562 Pain in left knee: Secondary | ICD-10-CM | POA: Diagnosis not present

## 2021-01-01 DIAGNOSIS — M25462 Effusion, left knee: Secondary | ICD-10-CM | POA: Diagnosis not present

## 2021-01-01 DIAGNOSIS — R29898 Other symptoms and signs involving the musculoskeletal system: Secondary | ICD-10-CM | POA: Diagnosis not present

## 2021-01-09 DIAGNOSIS — Z7409 Other reduced mobility: Secondary | ICD-10-CM | POA: Diagnosis not present

## 2021-01-09 DIAGNOSIS — Z96652 Presence of left artificial knee joint: Secondary | ICD-10-CM | POA: Diagnosis not present

## 2021-01-09 DIAGNOSIS — M25562 Pain in left knee: Secondary | ICD-10-CM | POA: Diagnosis not present

## 2021-01-09 DIAGNOSIS — R29898 Other symptoms and signs involving the musculoskeletal system: Secondary | ICD-10-CM | POA: Diagnosis not present

## 2021-01-09 DIAGNOSIS — M25662 Stiffness of left knee, not elsewhere classified: Secondary | ICD-10-CM | POA: Diagnosis not present

## 2021-01-09 DIAGNOSIS — M25462 Effusion, left knee: Secondary | ICD-10-CM | POA: Diagnosis not present

## 2021-01-09 DIAGNOSIS — Z96651 Presence of right artificial knee joint: Secondary | ICD-10-CM | POA: Diagnosis not present

## 2021-01-15 DIAGNOSIS — Z96652 Presence of left artificial knee joint: Secondary | ICD-10-CM | POA: Diagnosis not present

## 2021-01-15 DIAGNOSIS — Z7409 Other reduced mobility: Secondary | ICD-10-CM | POA: Diagnosis not present

## 2021-01-15 DIAGNOSIS — R29898 Other symptoms and signs involving the musculoskeletal system: Secondary | ICD-10-CM | POA: Diagnosis not present

## 2021-01-15 DIAGNOSIS — M25662 Stiffness of left knee, not elsewhere classified: Secondary | ICD-10-CM | POA: Diagnosis not present

## 2021-01-15 DIAGNOSIS — M25562 Pain in left knee: Secondary | ICD-10-CM | POA: Diagnosis not present

## 2021-01-15 DIAGNOSIS — M25462 Effusion, left knee: Secondary | ICD-10-CM | POA: Diagnosis not present

## 2021-03-20 DIAGNOSIS — E7800 Pure hypercholesterolemia, unspecified: Secondary | ICD-10-CM | POA: Diagnosis not present

## 2021-03-20 DIAGNOSIS — G4733 Obstructive sleep apnea (adult) (pediatric): Secondary | ICD-10-CM | POA: Diagnosis not present

## 2021-03-20 DIAGNOSIS — E119 Type 2 diabetes mellitus without complications: Secondary | ICD-10-CM | POA: Diagnosis not present

## 2021-03-20 DIAGNOSIS — R5383 Other fatigue: Secondary | ICD-10-CM | POA: Diagnosis not present

## 2021-03-20 DIAGNOSIS — I10 Essential (primary) hypertension: Secondary | ICD-10-CM | POA: Diagnosis not present

## 2021-03-20 DIAGNOSIS — Z862 Personal history of diseases of the blood and blood-forming organs and certain disorders involving the immune mechanism: Secondary | ICD-10-CM | POA: Diagnosis not present

## 2021-04-22 DIAGNOSIS — G4733 Obstructive sleep apnea (adult) (pediatric): Secondary | ICD-10-CM | POA: Diagnosis not present

## 2021-06-05 DIAGNOSIS — I10 Essential (primary) hypertension: Secondary | ICD-10-CM | POA: Diagnosis not present

## 2021-06-05 DIAGNOSIS — E119 Type 2 diabetes mellitus without complications: Secondary | ICD-10-CM | POA: Diagnosis not present

## 2021-06-05 DIAGNOSIS — M1711 Unilateral primary osteoarthritis, right knee: Secondary | ICD-10-CM | POA: Diagnosis not present

## 2021-06-05 DIAGNOSIS — M1990 Unspecified osteoarthritis, unspecified site: Secondary | ICD-10-CM | POA: Diagnosis not present

## 2021-06-05 DIAGNOSIS — E7800 Pure hypercholesterolemia, unspecified: Secondary | ICD-10-CM | POA: Diagnosis not present

## 2021-06-05 DIAGNOSIS — K219 Gastro-esophageal reflux disease without esophagitis: Secondary | ICD-10-CM | POA: Diagnosis not present

## 2021-06-05 DIAGNOSIS — M1712 Unilateral primary osteoarthritis, left knee: Secondary | ICD-10-CM | POA: Diagnosis not present

## 2021-07-22 DIAGNOSIS — G4733 Obstructive sleep apnea (adult) (pediatric): Secondary | ICD-10-CM | POA: Diagnosis not present

## 2021-07-24 DIAGNOSIS — H2513 Age-related nuclear cataract, bilateral: Secondary | ICD-10-CM | POA: Diagnosis not present

## 2021-07-24 DIAGNOSIS — H524 Presbyopia: Secondary | ICD-10-CM | POA: Diagnosis not present

## 2021-07-24 DIAGNOSIS — H43813 Vitreous degeneration, bilateral: Secondary | ICD-10-CM | POA: Diagnosis not present

## 2021-07-24 DIAGNOSIS — E119 Type 2 diabetes mellitus without complications: Secondary | ICD-10-CM | POA: Diagnosis not present

## 2021-08-04 DIAGNOSIS — E119 Type 2 diabetes mellitus without complications: Secondary | ICD-10-CM | POA: Diagnosis not present

## 2021-08-04 DIAGNOSIS — K219 Gastro-esophageal reflux disease without esophagitis: Secondary | ICD-10-CM | POA: Diagnosis not present

## 2021-08-04 DIAGNOSIS — E7800 Pure hypercholesterolemia, unspecified: Secondary | ICD-10-CM | POA: Diagnosis not present

## 2021-08-04 DIAGNOSIS — I10 Essential (primary) hypertension: Secondary | ICD-10-CM | POA: Diagnosis not present

## 2021-08-04 DIAGNOSIS — Z862 Personal history of diseases of the blood and blood-forming organs and certain disorders involving the immune mechanism: Secondary | ICD-10-CM | POA: Diagnosis not present

## 2021-08-04 DIAGNOSIS — Z0000 Encounter for general adult medical examination without abnormal findings: Secondary | ICD-10-CM | POA: Diagnosis not present

## 2021-08-19 DIAGNOSIS — I10 Essential (primary) hypertension: Secondary | ICD-10-CM | POA: Diagnosis not present

## 2021-08-19 DIAGNOSIS — K219 Gastro-esophageal reflux disease without esophagitis: Secondary | ICD-10-CM | POA: Diagnosis not present

## 2021-08-19 DIAGNOSIS — E7800 Pure hypercholesterolemia, unspecified: Secondary | ICD-10-CM | POA: Diagnosis not present

## 2021-08-19 DIAGNOSIS — E119 Type 2 diabetes mellitus without complications: Secondary | ICD-10-CM | POA: Diagnosis not present

## 2021-08-19 DIAGNOSIS — I2510 Atherosclerotic heart disease of native coronary artery without angina pectoris: Secondary | ICD-10-CM | POA: Diagnosis not present

## 2021-08-19 DIAGNOSIS — H35033 Hypertensive retinopathy, bilateral: Secondary | ICD-10-CM | POA: Diagnosis not present

## 2021-10-07 ENCOUNTER — Ambulatory Visit: Payer: Medicare Other | Admitting: Cardiovascular Disease

## 2021-10-07 ENCOUNTER — Other Ambulatory Visit: Payer: Self-pay

## 2021-10-07 ENCOUNTER — Encounter: Payer: Self-pay | Admitting: Cardiovascular Disease

## 2021-10-07 ENCOUNTER — Telehealth: Payer: Self-pay | Admitting: Cardiovascular Disease

## 2021-10-07 VITALS — BP 110/72 | HR 61 | Ht 71.5 in | Wt 204.0 lb

## 2021-10-07 DIAGNOSIS — I2510 Atherosclerotic heart disease of native coronary artery without angina pectoris: Secondary | ICD-10-CM

## 2021-10-07 MED ORDER — EZETIMIBE 10 MG PO TABS: 10.0000 mg | ORAL_TABLET | Freq: Every day | ORAL | 3 refills | Status: DC

## 2021-10-07 NOTE — Progress Notes (Signed)
Chief Complaint  Patient presents with   Follow-up    CAD   History of Present Illness: 71 yo male with history of DM, HTN, HLD who is here today for cardiac follow up. I saw him as a new consult in September 2021 for the evaluation of chest pressure. He had an episode of severe left sided chest pressure with radiation of pressure into his left arm. The pain lasted for 10 minutes. This recurred in several hours. No associated dyspnea, N/V or dizziness. No chest pain in the two months prior to his first visit in our office. He has had dyspnea with exertion. No LE edema. Echo 05/28/20 with LVEF=60-65%. Gated cardiac CTA September 2021 with mild to moderate CAD, no focally obstructive lesions by CT FFR.   He is here today for follow up. The patient denies any chest pain, dyspnea, palpitations, lower extremity edema, orthopnea, PND, dizziness, near syncope or syncope.   Primary Care Physician: Shirline Frees, MD  Past Medical History:  Diagnosis Date   Anginal pain ([facility]) 03/2020   anxiety   Arthritis    knees   Diabetes mellitus without complication ([place])    GERD (gastroesophageal reflux disease)    Hyperlipidemia    Hypertension    Sleep apnea    c-pap    Past Surgical History:  Procedure Laterality Date   KNEE ARTHROSCOPY Bilateral 1990-97   x 3   PARTIAL KNEE ARTHROPLASTY Left 10/27/2020   Procedure: UNICOMPARTMENTAL KNEE;  Surgeon: Vickey Huger, MD;  Location: [facility];  Service: Orthopedics;  Laterality: Left;   right knee replacement     SPLENECTOMY  1966   TONSILLECTOMY  1995    Current Outpatient Medications  Medication Sig Dispense Refill   acetaminophen (TYLENOL) 500 MG tablet Take 500 mg by mouth every 8 (eight) hours as needed for moderate pain or headache.     aspirin EC 325 MG tablet Take 1 tablet (325 mg total) by mouth 2 (two) times daily. 30 tablet 0   b complex vitamins capsule Take 1 capsule by mouth daily.     cholecalciferol (VITAMIN D3) 25 MCG (1000 UNIT)  tablet Take 1,000 Units by mouth daily.     ezetimibe (ZETIA) 10 MG tablet Take 1 tablet (10 mg total) by mouth daily. 90 tablet 3   Glucosamine HCl-MSM (GLUCOSAMINE-MSM PO) Take 1 tablet by mouth daily.     glucose blood (ONETOUCH ULTRA) test strip as directed.     Krill Oil 500 MG CAPS Take 500 mg by mouth daily.     Lancets (ONETOUCH DELICA PLUS ZDGLOV56E) MISC as directed.     lisinopril (PRINIVIL,ZESTRIL) 10 MG tablet Take 10 mg by mouth daily.     metFORMIN (GLUCOPHAGE) 500 MG tablet Take 1,500 mg by mouth 2 (two) times daily with a meal. Pt takes 1000 mg in the morning and 500 mg in the at night.     nitroGLYCERIN (NITROSTAT) 0.4 MG SL tablet Place 0.4 mg under the tongue every 5 (five) minutes as needed for chest pain.     omeprazole (PRILOSEC) 40 MG capsule Take 40 mg by mouth daily.     OVER THE COUNTER MEDICATION Take 1 capsule by mouth daily. Cinsulin otc supplement     simvastatin (ZOCOR) 40 MG tablet Take 40 mg by mouth daily.     TURMERIC PO Take 1,500 mg by mouth daily.     Ubiquinol 100 MG CAPS Take 100 mg by mouth daily.  vitamin E 180 MG (400 UNITS) capsule Take 400 Units by mouth daily.     gabapentin (NEURONTIN) 300 MG capsule Take 1 capsule (300 mg total) by mouth 3 (three) times daily. (Patient not taking: Reported on 10/07/2021) 30 capsule 0   methocarbamol (ROBAXIN) 500 MG tablet Take 1-2 tablets (500-1,000 mg total) by mouth 4 (four) times daily. (Patient not taking: Reported on 10/07/2021) 60 tablet 0   oxyCODONE (OXY IR/ROXICODONE) 5 MG immediate release tablet Take 1-2 tablets (5-10 mg total) by mouth every 6 (six) hours as needed for severe pain. (Patient not taking: Reported on 10/07/2021) 40 tablet 0   No current facility-administered medications for this visit.    Allergies  Allergen Reactions   Sulfa Antibiotics     Unknown reaction    Social History   Socioeconomic History   Marital status: Married    Spouse name: Not on file   Number of children: 3    Years of education: Not on file   Highest education level: Not on file  Occupational History   Occupation: Retired-Sales  Tobacco Use   Smoking status: Former    Packs/day: 1.00    Years: 20.00    Pack years: 20.00    Types: Cigarettes    Quit date: 09/06/1988    Years since quitting: 33.1   Smokeless tobacco: Never  Vaping Use   Vaping Use: Never used  Substance and Sexual Activity   Alcohol use: Yes    Alcohol/week: 7.0 standard drinks    Types: 7 Standard drinks or equivalent per week   Drug use: Never   Sexual activity: Not on file  Other Topics Concern   Not on file  Social History Narrative   Not on file   Social Determinants of Health   Financial Resource Strain: Not on file  Food Insecurity: Not on file  Transportation Needs: Not on file  Physical Activity: Not on file  Stress: Not on file  Social Connections: Not on file  Intimate Partner Violence: Not on file    Family History  Problem Relation Age of Onset   Heart attack Mother 67   Stroke Father     Review of Systems:  As stated in the HPI and otherwise negative.   BP 110/72    Pulse 61    Ht 5' 11.5" (1.816 m)    Wt 204 lb (92.5 kg)    SpO2 98%    BMI 28.06 kg/m   Physical Examination: General: Well developed, well nourished, NAD  HEENT: OP clear, mucus membranes moist  SKIN: warm, dry. No rashes. Neuro: No focal deficits  Musculoskeletal: Muscle strength 5/5 all ext  Psychiatric: Mood and affect normal  Neck: No JVD, no carotid bruits, no thyromegaly, no lymphadenopathy.  Lungs:Clear bilaterally, no wheezes, rhonci, crackles Cardiovascular: Regular rate and rhythm. No murmurs, gallops or rubs. Abdomen:Soft. Bowel sounds present. Non-tender.  Extremities: No lower extremity edema. Pulses are 2 + in the bilateral DP/PT.  EKG:  EKG is ordered today. The ekg ordered today demonstrates Sinus, RBBB  Echo 05/28/20:  1. Left ventricular ejection fraction, by estimation, is 60 to 65%. The  left  ventricle has normal function. The left ventricle has no regional  wall motion abnormalities. There is mild left ventricular hypertrophy.  Left ventricular diastolic parameters  were normal. GLS -21.8%, normal.   2. Right ventricular systolic function is normal. The right ventricular  size is normal. There is normal pulmonary artery systolic pressure. The  estimated right  ventricular systolic pressure is 38.3 mmHg.   3. Left atrial size was mildly dilated.   4. The mitral valve is normal in structure. Trivial mitral valve  regurgitation. No evidence of mitral stenosis.   5. The aortic valve is tricuspid. Aortic valve regurgitation is not  visualized. No aortic stenosis is present.   6. Aortic dilatation noted. There is mild dilatation of the ascending  aorta, measuring 38 mm.   7. The inferior vena cava is normal in size with greater than 50%  respiratory variability, suggesting right atrial pressure of 3 mmHg.   Recent Labs: 10/17/2020: ALT 21; BUN 14; BUN 14; Creatinine, Ser 0.87; Creatinine, Ser 0.88; Hemoglobin 15.8; Platelets 364; Potassium 5.3; Potassium 5.5; Sodium 133; Sodium 139   Lipid Panel No results found for: CHOL, TRIG, HDL, CHOLHDL, VLDL, LDLCALC, LDLDIRECT   Wt Readings from Last 3 Encounters:  10/07/21 204 lb (92.5 kg)  10/27/20 205 lb (93 kg)  10/17/20 205 lb (93 kg)    Assessment and Plan:   1. CAD without angina: Mild to moderate non-obstructive CAD by cardiac CTA September 2021. He has no chest pain. Continue ASA and statin. Add Zetia 10 mg daily. LDL goal 70. His LDL was 88 in July 2022. He has not tolerated Crestor in the past. He will ask Dr. Kenton Kingfisher to check his lipids in 12 weeks. If he cannot get to his goal LDL on Zocor and Zetia, can either consider Lipitor or Repatha.   Current medicines are reviewed at length with the patient today.  The patient does not have concerns regarding medicines.  The following changes have been made:  no change  Labs/ tests  ordered today include:   Orders Placed This Encounter  Procedures   EKG 12-Lead   Disposition:   FU with me in 12 months.    Signed, Lauree Chandler, MD 10/07/2021 12:12 PM    [address], [place], [place]  [address] Phone: [phone]; Fax: [phone]

## 2021-10-07 NOTE — Telephone Encounter (Signed)
Pt's medication was sent to pt's pharmacy as requested. Confirmation received.  °

## 2021-10-07 NOTE — Telephone Encounter (Signed)
°*  STAT* If patient is at the pharmacy, call can be transferred to refill team.   1. Which medications need to be refilled? (please list name of each medication and dose if known) ezetimibe (ZETIA) 10 MG tablet  2. Which pharmacy/location (including street and city if local pharmacy) is medication to be sent to? [facility] ([place], [place]  3. Do they need a 30 day or 90 day supply? 90  Patient needs refill to go through [facility]

## 2021-10-07 NOTE — Patient Instructions (Signed)
Medication Instructions:  1) Start Ezetimibe (Zetia) 10 mg daily    2) Start Aspirin 81 mg daily   *If you need a refill on your cardiac medications before your next appointment, please call your pharmacy*   Lab Work: None ordered   If you have labs (blood work) drawn today and your tests are completely normal, you will receive your results only by: [facility] (if you have MyChart) OR A paper copy in the mail If you have any lab test that is abnormal or we need to change your treatment, we will call you to review the results.   Testing/Procedures: None ordered    Follow-Up: At [facility], you and your health needs are our priority.  As part of our continuing mission to provide you with exceptional heart care, we have created designated Provider Care Teams.  These Care Teams include your primary Cardiologist (physician) and Advanced Practice Providers (APPs -  Physician Assistants and Nurse Practitioners) who all work together to provide you with the care you need, when you need it.  We recommend signing up for the patient portal called "MyChart".  Sign up information is provided on this After Visit Summary.  MyChart is used to connect with patients for Virtual Visits (Telemedicine).  Patients are able to view lab/test results, encounter notes, upcoming appointments, etc.  Non-urgent messages can be sent to your provider as well.   To learn more about what you can do with MyChart, go to [email or web address].    Your next appointment:   12 month(s)  The format for your next appointment:   In Person  Provider:   Lauree Chandler, MD     Other Instructions None

## 2021-10-15 DIAGNOSIS — Z96653 Presence of artificial knee joint, bilateral: Secondary | ICD-10-CM | POA: Diagnosis not present

## 2021-10-22 DIAGNOSIS — E119 Type 2 diabetes mellitus without complications: Secondary | ICD-10-CM | POA: Diagnosis not present

## 2021-10-22 DIAGNOSIS — E7800 Pure hypercholesterolemia, unspecified: Secondary | ICD-10-CM | POA: Diagnosis not present

## 2021-10-22 DIAGNOSIS — K219 Gastro-esophageal reflux disease without esophagitis: Secondary | ICD-10-CM | POA: Diagnosis not present

## 2021-10-22 DIAGNOSIS — I10 Essential (primary) hypertension: Secondary | ICD-10-CM | POA: Diagnosis not present

## 2021-11-27 DIAGNOSIS — L821 Other seborrheic keratosis: Secondary | ICD-10-CM | POA: Diagnosis not present

## 2021-11-27 DIAGNOSIS — L814 Other melanin hyperpigmentation: Secondary | ICD-10-CM | POA: Diagnosis not present

## 2021-11-27 DIAGNOSIS — D225 Melanocytic nevi of trunk: Secondary | ICD-10-CM | POA: Diagnosis not present

## 2021-12-16 DIAGNOSIS — G4733 Obstructive sleep apnea (adult) (pediatric): Secondary | ICD-10-CM | POA: Diagnosis not present

## 2021-12-25 ENCOUNTER — Encounter: Payer: Self-pay | Admitting: Emergency Medicine

## 2021-12-25 ENCOUNTER — Ambulatory Visit
Admission: EM | Admit: 2021-12-25 | Discharge: 2021-12-25 | Disposition: A | Discharge status: Home / Self Care | Payer: Medicare Other | Attending: Physician Assistant | Admitting: Physician Assistant

## 2021-12-25 DIAGNOSIS — J029 Acute pharyngitis, unspecified: Secondary | ICD-10-CM | POA: Diagnosis not present

## 2021-12-25 DIAGNOSIS — R0981 Nasal congestion: Secondary | ICD-10-CM | POA: Diagnosis not present

## 2021-12-25 DIAGNOSIS — Z20822 Contact with and (suspected) exposure to covid-19: Secondary | ICD-10-CM | POA: Diagnosis not present

## 2021-12-25 LAB — POCT RAPID STREP A (OFFICE): Rapid Strep A Screen: NEGATIVE

## 2021-12-25 MED ORDER — FLUTICASONE PROPIONATE 50 MCG/ACT NA SUSP: 1.0000 | Freq: Every day | NASAL | 0 refills | Status: AC

## 2021-12-25 NOTE — Discharge Instructions (Signed)
Your strep was negative.  We will contact you in a couple days if your COVID/flu testing is positive.  Monitor your MyChart for these results.  Gargle with warm salt water several times per day.  Use Flonase and allergy medication for symptom relief.  Use Tylenol and ibuprofen for pain.  If you have any worsening symptoms please return for reevaluation.  If symptoms or not improving within a week you should be seen again. ?

## 2021-12-25 NOTE — ED Provider Notes (Signed)
?[facility] ? ? ? ?CSN: [id number] ?Arrival date & time: 12/25/21  1425 ? ? ?  ? ?History   ?Chief Complaint ?Chief Complaint  ?Patient presents with  ? Sore Throat  ? ? ?HPI ?Gary Woodward is a 71 y.o. male.  ? ?Patient presents today with a 1 day history of URI symptoms.  Reports sore throat, cough, nasal congestion.  Reports multiple sick contacts including his wife and son who he recently went on vacation.  He has tried over-the-counter allergy medicine and Alka-Seltzer cold medicine without improvement of symptoms.  He denies formal diagnosis of allergies.  He denies history of asthma, COPD.  He is a former smoker quit many years ago (more than 20 years ago).  He has had all of his COVID vaccines but has not had COVID in the past.  He is concerned that he has strep throat given severity of sore throat.  He is eating and drinking normally.  Denies any muffled voice, dysphagia, odynophagia, fever, chest pain, shortness of breath, nausea, vomiting. ? ? ?Past Medical History:  ?Diagnosis Date  ? Anginal pain ([place]) 03/2020  ? anxiety  ? Arthritis   ? knees  ? Diabetes mellitus without complication ([place])   ? GERD (gastroesophageal reflux disease)   ? Hyperlipidemia   ? Hypertension   ? Sleep apnea   ? c-pap  ? ? ?There are no problems to display for this patient. ? ? ?Past Surgical History:  ?Procedure Laterality Date  ? KNEE ARTHROSCOPY Bilateral 1990-97  ? x 3  ? PARTIAL KNEE ARTHROPLASTY Left 10/27/2020  ? Procedure: UNICOMPARTMENTAL KNEE;  Surgeon: Vickey Huger, MD;  Location: [facility];  Service: Orthopedics;  Laterality: Left;  ? right knee replacement    ? SPLENECTOMY  1966  ? TONSILLECTOMY  1995  ? ? ? ? ? ?Home Medications   ? ?Prior to Admission medications   ?Medication Sig Start Date End Date Taking? Authorizing Provider  ?acetaminophen (TYLENOL) 500 MG tablet Take 500 mg by mouth every 8 (eight) hours as needed for moderate pain or headache.   Yes [provider]  ?b complex vitamins  capsule Take 1 capsule by mouth daily.   Yes [provider]  ?cholecalciferol (VITAMIN D3) 25 MCG (1000 UNIT) tablet Take 1,000 Units by mouth daily.   Yes [provider]  ?ezetimibe (ZETIA) 10 MG tablet Take 1 tablet (10 mg total) by mouth daily. 10/07/21  Yes Burnell Blanks, MD  ?fluticasone (FLONASE) 50 MCG/ACT nasal spray Place 1 spray into both nostrils daily. 12/25/21  Yes Jaylon Boylen, Derry Skill, PA-C  ?Glucosamine HCl-MSM (GLUCOSAMINE-MSM PO) Take 1 tablet by mouth daily.   Yes [provider]  ?glucose blood (ONETOUCH ULTRA) test strip as directed.   Yes [provider]  ?Astrid Drafts 500 MG CAPS Take 500 mg by mouth daily.   Yes [provider]  ?Lancets (ONETOUCH DELICA PLUS [id number]) [place] as directed.   Yes [provider]  ?lisinopril (PRINIVIL,ZESTRIL) 10 MG tablet Take 10 mg by mouth daily.   Yes [provider]  ?metFORMIN (GLUCOPHAGE) 500 MG tablet Take 1,500 mg by mouth 2 (two) times daily with a meal. Pt takes 1000 mg in the morning and 500 mg in the at night.   Yes [provider]  ?nitroGLYCERIN (NITROSTAT) 0.4 MG SL tablet Place 0.4 mg under the tongue every 5 (five) minutes as needed for chest pain. 03/19/20  Yes [provider]  ?omeprazole (PRILOSEC) 40 MG  capsule Take 40 mg by mouth daily.   Yes [provider]  ?simvastatin (ZOCOR) 40 MG tablet Take 40 mg by mouth daily.   Yes [provider]  ?TURMERIC PO Take 1,500 mg by mouth daily.   Yes [provider]  ?vitamin E 180 MG (400 UNITS) capsule Take 400 Units by mouth daily.   Yes [provider]  ?gabapentin (NEURONTIN) 300 MG capsule Take 1 capsule (300 mg total) by mouth 3 (three) times daily. ?Patient not taking: Reported on 10/07/2021 10/27/20   Donia Ast, PA  ?methocarbamol (ROBAXIN) 500 MG tablet Take 1-2 tablets (500-1,000 mg total) by mouth 4 (four) times daily. ?Patient not taking: Reported on 10/07/2021 10/27/20    Donia Ast, PA  ?OVER THE COUNTER MEDICATION Take 1 capsule by mouth daily. Cinsulin otc supplement    [provider]  ?oxyCODONE (OXY IR/ROXICODONE) 5 MG immediate release tablet Take 1-2 tablets (5-10 mg total) by mouth every 6 (six) hours as needed for severe pain. ?Patient not taking: Reported on 10/07/2021 10/27/20   Donia Ast, [place]  ?Ubiquinol 100 MG CAPS Take 100 mg by mouth daily.    [provider]  ? ? ?Family History ?Family History  ?Problem Relation Age of Onset  ? Heart attack Mother 104  ? Stroke Father   ? ? ?Social History ?Social History  ? ?Tobacco Use  ? Smoking status: Former  ?  Packs/day: 1.00  ?  Years: 20.00  ?  Pack years: 20.00  ?  Types: Cigarettes  ?  Quit date: 09/06/1988  ?  Years since quitting: 33.3  ? Smokeless tobacco: Never  ?Vaping Use  ? Vaping Use: Never used  ?Substance Use Topics  ? Alcohol use: Yes  ?  Alcohol/week: 7.0 standard drinks  ?  Types: 7 Standard drinks or equivalent per week  ? Drug use: Never  ? ? ? ?Allergies   ?Sulfa antibiotics ? ? ?Review of Systems ?Review of Systems  ?Constitutional:  Positive for activity change. Negative for appetite change, fatigue and fever.  ?HENT:  Positive for congestion and sore throat. Negative for sinus pressure and sneezing.   ?Respiratory:  Positive for cough. Negative for shortness of breath.   ?Cardiovascular:  Negative for chest pain.  ?Gastrointestinal:  Negative for abdominal pain, diarrhea, nausea and vomiting.  ?Neurological:  Negative for dizziness, light-headedness and headaches.  ? ? ?Physical Exam ?Triage Vital Signs ?ED Triage Vitals  ?Enc Vitals Group  ?   BP 12/25/21 1605 137/78  ?   Pulse Rate 12/25/21 1605 64  ?   Resp 12/25/21 1605 18  ?   Temp 12/25/21 1605 98.3 ?F (36.8 ?C)  ?   Temp Source 12/25/21 1605 Oral  ?   SpO2 12/25/21 1605 94 %  ?   Weight 12/25/21 1606 205 lb (93 kg)  ?   Height 12/25/21 1606 5' 11.5" (1.816 m)  ?   Head Circumference --   ?   Peak Flow --   ?   Pain  Score 12/25/21 1606 1  ?   Pain Loc --   ?   Pain Edu? --   ?   Excl. in [place]? --   ? ?No data found. ? ?Updated Vital Signs ?BP 137/78 (BP Location: Right Arm)   Pulse 64   Temp 98.3 ?F (36.8 ?C) (Oral)   Resp 18   Ht 5' 11.5" (1.816 m)   Wt 205 lb (93 kg)  SpO2 94%   BMI 28.19 kg/m?  ? ?Visual Acuity ?Right Eye Distance:   ?Left Eye Distance:   ?Bilateral Distance:   ? ?Right Eye Near:   ?Left Eye Near:    ?Bilateral Near:    ? ?Physical Exam ?Vitals reviewed.  ?Constitutional:   ?   General: He is awake.  ?   Appearance: Normal appearance. He is well-developed. He is not ill-appearing.  ?   Comments: Very pleasant male appears stated age in no acute distress sitting comfortably in exam room  ?HENT:  ?   Head: Normocephalic and atraumatic.  ?   Right Ear: Tympanic membrane, ear canal and external ear normal. Tympanic membrane is not erythematous or bulging.  ?   Left Ear: Tympanic membrane, ear canal and external ear normal. Tympanic membrane is not erythematous or bulging.  ?   Nose: Nose normal.  ?   Mouth/Throat:  ?   Pharynx: Uvula midline. Posterior oropharyngeal erythema present. No oropharyngeal exudate or uvula swelling.  ?   Comments: Tonsils and uvula surgically absent ?Cardiovascular:  ?   Rate and Rhythm: Normal rate and regular rhythm.  ?   Heart sounds: Normal heart sounds, S1 normal and S2 normal. No murmur heard. ?Pulmonary:  ?   Effort: Pulmonary effort is normal. No accessory muscle usage or respiratory distress.  ?   Breath sounds: Normal breath sounds. No stridor. No wheezing, rhonchi or rales.  ?   Comments: Clear to auscultation bilaterally ?Abdominal:  ?   General: Bowel sounds are normal.  ?   Palpations: Abdomen is soft.  ?   Tenderness: There is no abdominal tenderness.  ?Lymphadenopathy:  ?   Head:  ?   Right side of head: No submental, submandibular or tonsillar adenopathy.  ?   Left side of head: No submental, submandibular or tonsillar adenopathy.  ?   Cervical: No cervical  adenopathy.  ?Neurological:  ?   Mental Status: He is alert.  ?Psychiatric:     ?   Behavior: Behavior is cooperative.  ? ? ? ?UC Treatments / Results  ?Labs ?(all labs ordered are listed, but only abnormal resul

## 2021-12-25 NOTE — ED Triage Notes (Signed)
Patient c/o sore throat, cough, congestion x 1 day.  Patient has taken [place]. ?

## 2021-12-26 LAB — COVID-19, FLU A+B NAA
Influenza A, NAA: NOT DETECTED
Influenza B, NAA: NOT DETECTED
SARS-CoV-2, NAA: NOT DETECTED

## 2022-01-05 DIAGNOSIS — E7800 Pure hypercholesterolemia, unspecified: Secondary | ICD-10-CM | POA: Diagnosis not present

## 2022-01-11 DIAGNOSIS — G4733 Obstructive sleep apnea (adult) (pediatric): Secondary | ICD-10-CM | POA: Diagnosis not present

## 2022-01-11 DIAGNOSIS — E7800 Pure hypercholesterolemia, unspecified: Secondary | ICD-10-CM | POA: Diagnosis not present

## 2022-01-11 DIAGNOSIS — M1711 Unilateral primary osteoarthritis, right knee: Secondary | ICD-10-CM | POA: Diagnosis not present

## 2022-01-11 DIAGNOSIS — I10 Essential (primary) hypertension: Secondary | ICD-10-CM | POA: Diagnosis not present

## 2022-01-11 DIAGNOSIS — E119 Type 2 diabetes mellitus without complications: Secondary | ICD-10-CM | POA: Diagnosis not present

## 2022-01-11 DIAGNOSIS — Z862 Personal history of diseases of the blood and blood-forming organs and certain disorders involving the immune mechanism: Secondary | ICD-10-CM | POA: Diagnosis not present

## 2022-03-30 DIAGNOSIS — Z86010 Personal history of colonic polyps: Secondary | ICD-10-CM | POA: Diagnosis not present

## 2022-03-30 DIAGNOSIS — K5730 Diverticulosis of large intestine without perforation or abscess without bleeding: Secondary | ICD-10-CM | POA: Diagnosis not present

## 2022-03-30 DIAGNOSIS — K2100 Gastro-esophageal reflux disease with esophagitis, without bleeding: Secondary | ICD-10-CM | POA: Diagnosis not present

## 2022-05-13 IMAGING — CT CT HEART MORP W/ CTA COR W/ SCORE W/ CA W/CM &/OR W/O CM
1 of 20 series · 1 of 20 positions shown, 2 images · non-contrast
Comparison: None.

Addendum:
CLINICAL DATA: Chestpain

EXAM:
Cardiac/Coronary  CTA
TECHNIQUE: The patient was scanned on a Siemens Somatoform go.Top scanner.

[Series 6: sa36 calcium scoring 3.00 · axial · 0.40mm/px · z∈[+1748,+1748]mm · 1 of 45 slices shown, 2 images]
[im 1/45  vessel]
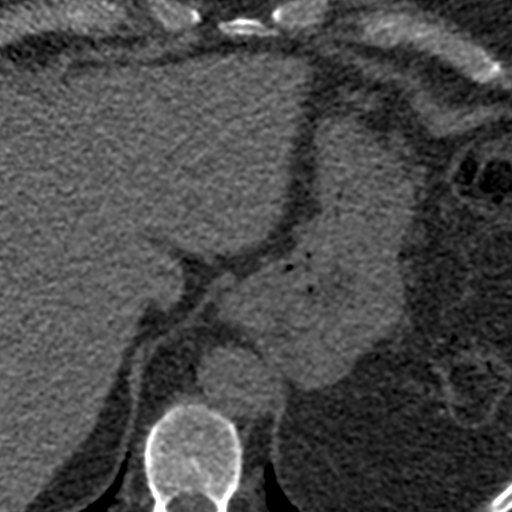
[im 1/45  lung]
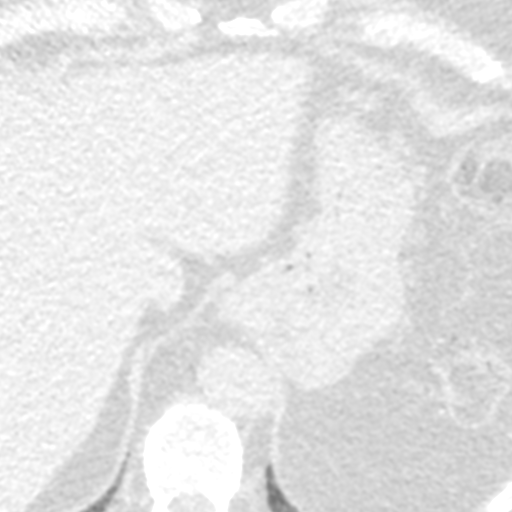

[1 of 20 positions shown; findings below may reference images not displayed]

FINDINGS: A retrospective scan was triggered in the descending thoracic aorta.
Axial non-contrast 3 mm slices were carried out through the heart.
The data set was analyzed on a dedicated work station and scored
using the Agatson method. Gantry rotation speed was 330 msecs and
collimation was .6 mm. 100mg of metoprolol and 0.8 mg of sl NTG was
given. The 3D data set was reconstructed in 5% intervals of the
45-95 % of the R-R cycle. Diastolic phases were analyzed on a
dedicated work station using MPR, MIP and VRT modes. The patient
received 75 cc of contrast.

Aorta: Normal size. Ascending aortic wall and arch calcifications.
No dissection.

Aortic Valve:  Trileaflet.  No calcifications.

Coronary Arteries:  Normal coronary origin.  Right dominance.

RCA is a large dominant artery that gives rise to PDA and PLA. There
is mild calcified plaque in the mid and distal RCA causing minimal
(0-24%) stenosis.

Left main is a large artery that gives rise to LAD and LCX arteries.

LAD is a large vessel that has calcified and non calcified plaque in
the proximal segment causing moderate stenosis (50-69%)

LCX is a non-dominant artery that gives rise to one large OM1
branch. There is calcified plaque in the proximal segment causing
mild stenosis (25-49%).

Other findings:

Normal pulmonary vein drainage into the left atrium.

Normal left atrial appendage without a thrombus.

Normal size of the pulmonary artery.
IMPRESSION: 1. Coronary calcium score of 482. This was 76th percentile for age
and sex matched control.

2. Normal coronary origin with right dominance.

3. Calcified and non calcified plaque in the proximal LAD causing
moderate stenosis (50-69%).

4. Calcified plaque in the proximal LCx causing mild stenosis
(25-49%)

5. CAD-RADS 3. Moderate stenosis. Consider symptom-guided
anti-ischemic pharmacotherapy as well as risk factor modification
per guideline directed care. Additional analysis with CT FFR will be
submitted and reported separately.

.

EXAM:
OVER-READ INTERPRETATION  CT CHEST

The following report is an over-read performed by radiologist Dr.
over-read does not include interpretation of cardiac or coronary
anatomy or pathology. The coronary calcium score and coronary CTA
interpretation by the cardiologist is attached.
FINDINGS: Vascular: Aortic atherosclerosis noted. No pericardial effusion
identified.

Mediastinum/nodes: No mass or adenopathy.

Lungs/pleura: No acute findings identified within the imaged
portions of the lower lungs. No pleural effusion, atelectasis or
airspace consolidation.

Upper abdomen: No acute abnormality.

Musculoskeletal no acute or suspicious osseous findings.
IMPRESSION: 1. No significant non-cardiac supplemental findings noted.

Aortic Atherosclerosis (EK2D1-0RR.R).

*** End of Addendum ***
FINDINGS: A retrospective scan was triggered in the descending thoracic aorta.
Axial non-contrast 3 mm slices were carried out through the heart.
The data set was analyzed on a dedicated work station and scored
using the Agatson method. Gantry rotation speed was 330 msecs and
collimation was .6 mm. 100mg of metoprolol and 0.8 mg of sl NTG was
given. The 3D data set was reconstructed in 5% intervals of the
45-95 % of the R-R cycle. Diastolic phases were analyzed on a
dedicated work station using MPR, MIP and VRT modes. The patient
received 75 cc of contrast.

Aorta: Normal size. Ascending aortic wall and arch calcifications.
No dissection.

Aortic Valve:  Trileaflet.  No calcifications.

Coronary Arteries:  Normal coronary origin.  Right dominance.

RCA is a large dominant artery that gives rise to PDA and PLA. There
is mild calcified plaque in the mid and distal RCA causing minimal
(0-24%) stenosis.

Left main is a large artery that gives rise to LAD and LCX arteries.

LAD is a large vessel that has calcified and non calcified plaque in
the proximal segment causing moderate stenosis (50-69%)

LCX is a non-dominant artery that gives rise to one large OM1
branch. There is calcified plaque in the proximal segment causing
mild stenosis (25-49%).

Other findings:

Normal pulmonary vein drainage into the left atrium.

Normal left atrial appendage without a thrombus.

Normal size of the pulmonary artery.
IMPRESSION: 1. Coronary calcium score of 482. This was 76th percentile for age
and sex matched control.

2. Normal coronary origin with right dominance.

3. Calcified and non calcified plaque in the proximal LAD causing
moderate stenosis (50-69%).

4. Calcified plaque in the proximal LCx causing mild stenosis
(25-49%)

5. CAD-RADS 3. Moderate stenosis. Consider symptom-guided
anti-ischemic pharmacotherapy as well as risk factor modification
per guideline directed care. Additional analysis with CT FFR will be
submitted and reported separately.

.

## 2022-08-03 DIAGNOSIS — H43813 Vitreous degeneration, bilateral: Secondary | ICD-10-CM | POA: Diagnosis not present

## 2022-08-03 DIAGNOSIS — H35371 Puckering of macula, right eye: Secondary | ICD-10-CM | POA: Diagnosis not present

## 2022-08-03 DIAGNOSIS — H52223 Regular astigmatism, bilateral: Secondary | ICD-10-CM | POA: Diagnosis not present

## 2022-08-03 DIAGNOSIS — H2513 Age-related nuclear cataract, bilateral: Secondary | ICD-10-CM | POA: Diagnosis not present

## 2022-08-03 DIAGNOSIS — E119 Type 2 diabetes mellitus without complications: Secondary | ICD-10-CM | POA: Diagnosis not present

## 2022-08-05 DIAGNOSIS — G4733 Obstructive sleep apnea (adult) (pediatric): Secondary | ICD-10-CM | POA: Diagnosis not present

## 2022-08-16 DIAGNOSIS — I10 Essential (primary) hypertension: Secondary | ICD-10-CM | POA: Diagnosis not present

## 2022-08-16 DIAGNOSIS — E7800 Pure hypercholesterolemia, unspecified: Secondary | ICD-10-CM | POA: Diagnosis not present

## 2022-08-16 DIAGNOSIS — E119 Type 2 diabetes mellitus without complications: Secondary | ICD-10-CM | POA: Diagnosis not present

## 2022-08-19 DIAGNOSIS — E119 Type 2 diabetes mellitus without complications: Secondary | ICD-10-CM | POA: Diagnosis not present

## 2022-08-19 DIAGNOSIS — E7800 Pure hypercholesterolemia, unspecified: Secondary | ICD-10-CM | POA: Diagnosis not present

## 2022-08-19 DIAGNOSIS — Z0000 Encounter for general adult medical examination without abnormal findings: Secondary | ICD-10-CM | POA: Diagnosis not present

## 2022-08-19 DIAGNOSIS — K219 Gastro-esophageal reflux disease without esophagitis: Secondary | ICD-10-CM | POA: Diagnosis not present

## 2022-08-19 DIAGNOSIS — Z862 Personal history of diseases of the blood and blood-forming organs and certain disorders involving the immune mechanism: Secondary | ICD-10-CM | POA: Diagnosis not present

## 2022-08-19 DIAGNOSIS — I10 Essential (primary) hypertension: Secondary | ICD-10-CM | POA: Diagnosis not present

## 2022-10-10 ENCOUNTER — Other Ambulatory Visit: Payer: Self-pay | Admitting: Cardiovascular Disease

## 2022-10-31 NOTE — Progress Notes (Unsigned)
Office Visit    Patient Name: Gary Woodward Date of Encounter: 11/02/2022  Primary Care Provider:  Shirline Frees, MD Primary Cardiologist:  Lauree Chandler, MD Primary Electrophysiologist: None  Chief Complaint    Gary Woodward is a 72 y.o. male with PMH of nonobstructive CAD, HTN, HLD, DM type II who presents today for annual follow-up.  Past Medical History    Past Medical History:  Diagnosis Date   Anginal pain ([place]) 03/2020   anxiety   Arthritis    knees   Diabetes mellitus without complication ([facility])    GERD (gastroesophageal reflux disease)    Hyperlipidemia    Hypertension    Sleep apnea    c-pap   Past Surgical History:  Procedure Laterality Date   KNEE ARTHROSCOPY Bilateral 1990-97   x 3   PARTIAL KNEE ARTHROPLASTY Left 10/27/2020   Procedure: UNICOMPARTMENTAL KNEE;  Surgeon: Vickey Huger, MD;  Location: [facility];  Service: Orthopedics;  Laterality: Left;   right knee replacement     SPLENECTOMY  1966   TONSILLECTOMY  1995    Allergies  Allergies  Allergen Reactions   Sulfa Antibiotics     Unknown reaction    History of Present Illness    Gary Woodward  is a 72 year old male with the above mention past medical history who presents today for 1 year follow-up.  Gary Woodward was initially seen by Dr. Angelena Form on 05/16/2020 for complaint of chest pain.  He underwent a gated cardiac CTA that revealed mild to moderate CAD, no focally obstructive lesions by CT FFR.  2D echo was also completed showing EF of 60-65% with mild LVH and mildly dilated LA with trivial MVR and mild dilation of aorta measuring 38 mm.  He was last seen by Dr. Angelena Form on 10/07/2021 for annual follow-up.  He had no new complaints and denied any chest pain or shortness of breath.  Patient's LDL was above goal of less than 70 and Zetia 10 mg was added to medications.  Referral to the lipid clinic will be made if patient cannot get to goal of less than 70 with Zetia for consideration of  PCSK9.  Gary Woodward presents today for 1 year follow-up alone.  Since last being seen in the office patient reports that he has been doing well with no new cardiac complaints.  He has recently been caring for his wife who has been ill.  He is staying active by walking his dog at least half a mile a day and is planning to get out and play golf as the weather warms.  He is following a heart healthy diet and eats red meat maybe once per week.  He has no complaints of chest pain shortness of breath and he is tolerating his Zetia and other statins without any adverse reactions.  He is reporting some sinus discomfort after using his CPAP but he is managing this well with saline nasal spray and Flonase as needed.  During our visit we discussed the importance of primary prevention and patient has a good grasp of the importance of compliance in managing progression of heart disease..  Patient denies chest pain, palpitations, dyspnea, PND, orthopnea, nausea, vomiting, dizziness, syncope, edema, weight gain, or early satiety.  Home Medications    Current Outpatient Medications  Medication Sig Dispense Refill   acetaminophen (TYLENOL) 500 MG tablet Take 500 mg by mouth every 8 (eight) hours as needed for moderate pain or headache.  aspirin EC 81 MG tablet Take 81 mg by mouth daily.     b complex vitamins capsule Take 1 capsule by mouth daily.     cholecalciferol (VITAMIN D3) 25 MCG (1000 UNIT) tablet Take 1,000 Units by mouth daily.     ezetimibe (ZETIA) 10 MG tablet TAKE 1 TABLET BY MOUTH DAILY 100 tablet 0   fluticasone (FLONASE) 50 MCG/ACT nasal spray Place 1 spray into both nostrils daily. 16 g 0   Glucosamine HCl-MSM (GLUCOSAMINE-MSM PO) Take 1 tablet by mouth daily.     glucose blood (ONETOUCH ULTRA) test strip as directed.     Krill Oil 500 MG CAPS Take 500 mg by mouth daily.     Lancets (ONETOUCH DELICA PLUS [id number]) MISC as directed.     lisinopril (PRINIVIL,ZESTRIL) 10 MG tablet Take 10 mg by  mouth daily.     metFORMIN (GLUCOPHAGE) 500 MG tablet Take 1,500 mg by mouth 2 (two) times daily with a meal. Pt takes 1000 mg in the morning and 500 mg in the at night.     nitroGLYCERIN (NITROSTAT) 0.4 MG SL tablet Place 0.4 mg under the tongue every 5 (five) minutes as needed for chest pain.     omeprazole (PRILOSEC) 40 MG capsule Take 40 mg by mouth daily.     OVER THE COUNTER MEDICATION Take 1 capsule by mouth daily. Cinsulin otc supplement     simvastatin (ZOCOR) 40 MG tablet Take 40 mg by mouth daily.     TURMERIC PO Take 1,500 mg by mouth daily.     Ubiquinol 100 MG CAPS Take 100 mg by mouth daily.     vitamin E 180 MG (400 UNITS) capsule Take 400 Units by mouth daily.     gabapentin (NEURONTIN) 300 MG capsule Take 1 capsule (300 mg total) by mouth 3 (three) times daily. 30 capsule 0   methocarbamol (ROBAXIN) 500 MG tablet Take 1-2 tablets (500-1,000 mg total) by mouth 4 (four) times daily. 60 tablet 0   oxyCODONE (OXY IR/ROXICODONE) 5 MG immediate release tablet Take 1-2 tablets (5-10 mg total) by mouth every 6 (six) hours as needed for severe pain. 40 tablet 0   No current facility-administered medications for this visit.     Review of Systems  Please see the history of present illness.    (+) Sinus discomfort  All other systems reviewed and are otherwise negative except as noted above.  Physical Exam    Wt Readings from Last 3 Encounters:  11/02/22 204 lb 9.6 oz (92.8 kg)  12/25/21 205 lb (93 kg)  10/07/21 204 lb (92.5 kg)   VS: Vitals:   11/02/22 0803  BP: 118/80  Pulse: 70  SpO2: 97%  ,Body mass index is 28.14 kg/m.  Constitutional:      Appearance: Healthy appearance. Not in distress.  Neck:     Vascular: JVD normal.  Pulmonary:     Effort: Pulmonary effort is normal.     Breath sounds: No wheezing. No rales. Diminished in the bases Cardiovascular:     Normal rate. Regular rhythm. Normal S1. Normal S2.      Murmurs: There is no murmur.  Edema:     Peripheral edema absent.  Abdominal:     Palpations: Abdomen is soft non tender. There is no hepatomegaly.  Skin:    General: Skin is warm and dry.  Neurological:     General: No focal deficit present.     Mental Status: Alert and oriented to person, place  and time.     Cranial Nerves: Cranial nerves are intact.  EKG/LABS/Other Studies Reviewed    ECG personally reviewed by me today -sinus rhythm with first-degree AVB and right bundle branch block with no acute changes consistent with previous EKG.  Lab Results  Component Value Date   WBC 13.0 (H) 10/17/2020   HGB 15.8 10/17/2020   HCT 48.5 10/17/2020   MCV 90.1 10/17/2020   PLT 364 10/17/2020   Lab Results  Component Value Date   CREATININE 0.87 10/17/2020   CREATININE 0.88 10/17/2020   BUN 14 10/17/2020   BUN 14 10/17/2020   NA 133 (L) 10/17/2020   NA 139 10/17/2020   K 5.3 (H) 10/17/2020   K 5.5 (H) 10/17/2020   CL 101 10/17/2020   CL 102 10/17/2020   CO2 26 10/17/2020   CO2 25 10/17/2020   Lab Results  Component Value Date   ALT 21 10/17/2020   AST 18 10/17/2020   ALKPHOS 53 10/17/2020   BILITOT 0.8 10/17/2020   No results found for: "CHOL", "HDL", "LDLCALC", "LDLDIRECT", "TRIG", "CHOLHDL"  Lab Results  Component Value Date   HGBA1C 7.4 (H) 10/17/2020    Assessment & Plan    1.  Nonobstructive CAD: -Continue cardiac CT completed 2021 with mild to moderate CAD, no focally obstructive lesions by CT FFR -Today patient denies any chest pain or cardiac discomfort with activities. -He is staying active by walking his dog half mile a day and is playing golf when weather permits. -We discussed the importance of primary prevention and following a heart healthy diet with 150 minutes of cardiovascular exercise per week.  2.  Essential hypertension: -Patient's blood pressure today was well-controlled at 118/80 -Continue Zestril 10 mg daily  3.  Hyperlipidemia: -Patient's last LDL cholesterol was above goal with  Zetia 10 mg added. -Most recent LDL was73 slightly above goal of less than 70 -He is tolerating his Zetia and Crestor without any myalgias and we will defer referral to lipid clinic at this time and continue to monitor.  4.  Obstructive sleep apnea: -Patient is compliant with CPAP nightly.  Disposition: Follow-up with Lauree Chandler, MD or APP in 12 months    Medication Adjustments/Labs and Tests Ordered: Current medicines are reviewed at length with the patient today.  Concerns regarding medicines are outlined above.   Signed, Mable Fill, Marissa Nestle, NP 11/02/2022, 8:15 AM [facility]  Note:  This document was prepared using Dragon voice recognition software and may include unintentional dictation errors.

## 2022-11-02 ENCOUNTER — Encounter: Payer: Self-pay | Admitting: Nurse Practitioner

## 2022-11-02 ENCOUNTER — Ambulatory Visit: Payer: Medicare Other | Attending: Nurse Practitioner | Admitting: Nurse Practitioner

## 2022-11-02 VITALS — BP 118/80 | HR 70 | Ht 71.5 in | Wt 204.6 lb

## 2022-11-02 DIAGNOSIS — I10 Essential (primary) hypertension: Secondary | ICD-10-CM

## 2022-11-02 DIAGNOSIS — I2510 Atherosclerotic heart disease of native coronary artery without angina pectoris: Secondary | ICD-10-CM

## 2022-11-02 DIAGNOSIS — G4733 Obstructive sleep apnea (adult) (pediatric): Secondary | ICD-10-CM

## 2022-11-02 DIAGNOSIS — E785 Hyperlipidemia, unspecified: Secondary | ICD-10-CM

## 2022-11-02 NOTE — Patient Instructions (Signed)
Medication Instructions:  Your physician recommends that you continue on your current medications as directed. Please refer to the Current Medication list given to you today. *If you need a refill on your cardiac medications before your next appointment, please call your pharmacy*   Lab Work: None Ordered If you have labs (blood work) drawn today and your tests are completely normal, you will receive your results only by: [place] (if you have MyChart) OR A paper copy in the mail If you have any lab test that is abnormal or we need to change your treatment, we will call you to review the results.   Testing/Procedures: None ordered    Follow-Up: At [facility], you and your health needs are our priority.  As part of our continuing mission to provide you with exceptional heart care, we have created designated Provider Care Teams.  These Care Teams include your primary Cardiologist (physician) and Advanced Practice Providers (APPs -  Physician Assistants and Nurse Practitioners) who all work together to provide you with the care you need, when you need it.  We recommend signing up for the patient portal called "MyChart".  Sign up information is provided on this After Visit Summary.  MyChart is used to connect with patients for Virtual Visits (Telemedicine).  Patients are able to view lab/test results, encounter notes, upcoming appointments, etc.  Non-urgent messages can be sent to your provider as well.   To learn more about what you can do with MyChart, go to [email or web address].    Your next appointment:   12 month(s)  Provider:   Lauree Chandler, MD     Other Instructions

## 2022-12-22 ENCOUNTER — Other Ambulatory Visit: Payer: Self-pay | Admitting: Cardiovascular Disease

## 2023-02-01 DIAGNOSIS — G4733 Obstructive sleep apnea (adult) (pediatric): Secondary | ICD-10-CM | POA: Diagnosis not present

## 2023-04-11 DIAGNOSIS — I10 Essential (primary) hypertension: Secondary | ICD-10-CM | POA: Diagnosis not present

## 2023-04-11 DIAGNOSIS — Z862 Personal history of diseases of the blood and blood-forming organs and certain disorders involving the immune mechanism: Secondary | ICD-10-CM | POA: Diagnosis not present

## 2023-04-11 DIAGNOSIS — E1165 Type 2 diabetes mellitus with hyperglycemia: Secondary | ICD-10-CM | POA: Diagnosis not present

## 2023-04-11 DIAGNOSIS — K219 Gastro-esophageal reflux disease without esophagitis: Secondary | ICD-10-CM | POA: Diagnosis not present

## 2023-04-11 DIAGNOSIS — I77810 Thoracic aortic ectasia: Secondary | ICD-10-CM | POA: Diagnosis not present

## 2023-04-11 DIAGNOSIS — I2510 Atherosclerotic heart disease of native coronary artery without angina pectoris: Secondary | ICD-10-CM | POA: Diagnosis not present

## 2023-08-09 DIAGNOSIS — H524 Presbyopia: Secondary | ICD-10-CM | POA: Diagnosis not present

## 2023-08-09 DIAGNOSIS — E119 Type 2 diabetes mellitus without complications: Secondary | ICD-10-CM | POA: Diagnosis not present

## 2023-08-09 DIAGNOSIS — H52223 Regular astigmatism, bilateral: Secondary | ICD-10-CM | POA: Diagnosis not present

## 2023-08-09 DIAGNOSIS — H35371 Puckering of macula, right eye: Secondary | ICD-10-CM | POA: Diagnosis not present

## 2023-08-09 DIAGNOSIS — H5213 Myopia, bilateral: Secondary | ICD-10-CM | POA: Diagnosis not present

## 2023-08-09 DIAGNOSIS — H2513 Age-related nuclear cataract, bilateral: Secondary | ICD-10-CM | POA: Diagnosis not present

## 2023-08-09 DIAGNOSIS — H43813 Vitreous degeneration, bilateral: Secondary | ICD-10-CM | POA: Diagnosis not present

## 2023-09-08 DIAGNOSIS — E119 Type 2 diabetes mellitus without complications: Secondary | ICD-10-CM | POA: Diagnosis not present

## 2023-09-08 DIAGNOSIS — I10 Essential (primary) hypertension: Secondary | ICD-10-CM | POA: Diagnosis not present

## 2023-09-08 DIAGNOSIS — E7800 Pure hypercholesterolemia, unspecified: Secondary | ICD-10-CM | POA: Diagnosis not present

## 2023-09-12 DIAGNOSIS — E7800 Pure hypercholesterolemia, unspecified: Secondary | ICD-10-CM | POA: Diagnosis not present

## 2023-09-12 DIAGNOSIS — K219 Gastro-esophageal reflux disease without esophagitis: Secondary | ICD-10-CM | POA: Diagnosis not present

## 2023-09-12 DIAGNOSIS — Z0000 Encounter for general adult medical examination without abnormal findings: Secondary | ICD-10-CM | POA: Diagnosis not present

## 2023-09-12 DIAGNOSIS — Z862 Personal history of diseases of the blood and blood-forming organs and certain disorders involving the immune mechanism: Secondary | ICD-10-CM | POA: Diagnosis not present

## 2023-09-12 DIAGNOSIS — E1165 Type 2 diabetes mellitus with hyperglycemia: Secondary | ICD-10-CM | POA: Diagnosis not present

## 2023-09-12 DIAGNOSIS — I10 Essential (primary) hypertension: Secondary | ICD-10-CM | POA: Diagnosis not present

## 2023-09-12 DIAGNOSIS — E119 Type 2 diabetes mellitus without complications: Secondary | ICD-10-CM | POA: Diagnosis not present

## 2023-09-20 DIAGNOSIS — Z860100 Personal history of colon polyps, unspecified: Secondary | ICD-10-CM | POA: Diagnosis not present

## 2023-09-20 DIAGNOSIS — K5730 Diverticulosis of large intestine without perforation or abscess without bleeding: Secondary | ICD-10-CM | POA: Diagnosis not present

## 2023-09-20 DIAGNOSIS — K219 Gastro-esophageal reflux disease without esophagitis: Secondary | ICD-10-CM | POA: Diagnosis not present

## 2023-10-26 ENCOUNTER — Other Ambulatory Visit: Payer: Self-pay | Admitting: Cardiovascular Disease

## 2023-11-02 ENCOUNTER — Ambulatory Visit: Payer: Medicare Other | Attending: Cardiology | Admitting: Cardiology

## 2023-11-02 ENCOUNTER — Encounter: Payer: Self-pay | Admitting: Cardiology

## 2023-11-02 VITALS — BP 108/82 | HR 73 | Ht 71.0 in | Wt 197.0 lb

## 2023-11-02 DIAGNOSIS — I10 Essential (primary) hypertension: Secondary | ICD-10-CM

## 2023-11-02 DIAGNOSIS — I2510 Atherosclerotic heart disease of native coronary artery without angina pectoris: Secondary | ICD-10-CM

## 2023-11-02 DIAGNOSIS — E785 Hyperlipidemia, unspecified: Secondary | ICD-10-CM

## 2023-11-02 NOTE — Progress Notes (Signed)
 Cardiology Office Note:    Date:  11/02/2023   ID:  Gary Woodward, DOB April 18, 1951, MRN [id number]  PCP:  Gary Retort, MD   [facility] Providers Cardiologist:  Gary Carrow, MD     Referring MD: Gary Retort, MD   Chief Complaint  Patient presents with   Follow-up   History of Present Illness:    Gary Woodward is a 73 y.o. male with a hx DM2, HTN, and HLD who presents for annual follow up, seen for Gary Woodward.   Gary Woodward was seen as a new consult 05/2020 for the evaluation of chest pressure. Echocardiogram showed normal LV function at 60-65% and gated cardiac CTA with mild to moderate CAD and no focal lesions by FFR.   He has since been very well with no recurrent symptoms. He stays very active and follows regularly with his PCP, Gary Woodward. Asking about coming off Lisinopril given a reaction a friend of his had. BP actually on the lower side today. He wishes to confirm this is ok to stop with Gary Woodward. Otherwise he denies chest pain, SOB, palpitations, LE edema, orthopnea, PND, dizziness, or syncope. Denies bleeding in stool or urine.    Past Medical History:  Diagnosis Date   Anginal pain (HCC) 03/2020   anxiety   Arthritis    knees   Diabetes mellitus without complication (HCC)    GERD (gastroesophageal reflux disease)    Hyperlipidemia    Hypertension    Sleep apnea    c-pap    Past Surgical History:  Procedure Laterality Date   KNEE ARTHROSCOPY Bilateral 1990-97   x 3   PARTIAL KNEE ARTHROPLASTY Left 10/27/2020   Procedure: UNICOMPARTMENTAL KNEE;  Surgeon: Dannielle Huh, MD;  Location: [facility];  Service: Orthopedics;  Laterality: Left;   right knee replacement     SPLENECTOMY  1966   TONSILLECTOMY  1995    Current Medications: Current Meds  Medication Sig   acetaminophen (TYLENOL) 500 MG tablet Take 500 mg by mouth every 8 (eight) hours as needed for moderate pain or headache.   aspirin EC 81 MG tablet Take 81 mg by mouth  daily.   b complex vitamins capsule Take 1 capsule by mouth daily.   cholecalciferol (VITAMIN D3) 25 MCG (1000 UNIT) tablet Take 1,000 Units by mouth daily.   ezetimibe (ZETIA) 10 MG tablet TAKE 1 TABLET BY MOUTH DAILY   fluticasone (FLONASE) 50 MCG/ACT nasal spray Place 1 spray into both nostrils daily.   Glucosamine HCl-MSM (GLUCOSAMINE-MSM PO) Take 1 tablet by mouth daily.   glucose blood (ONETOUCH ULTRA) test strip as directed.   Krill Oil 500 MG CAPS Take 500 mg by mouth daily.   Lancets (ONETOUCH DELICA PLUS LANCET30G) MISC as directed.   lisinopril (PRINIVIL,ZESTRIL) 10 MG tablet Take 10 mg by mouth daily.   metFORMIN (GLUCOPHAGE) 500 MG tablet Take 1,500 mg by mouth 2 (two) times daily with a meal. Pt takes 1000 mg in the morning and 500 mg in the at night.   MOUNJARO 5 MG/0.5ML Pen Inject 5 mg into the skin once a week.   nitroGLYCERIN (NITROSTAT) 0.4 MG SL tablet Place 0.4 mg under the tongue every 5 (five) minutes as needed for chest pain.   omeprazole (PRILOSEC) 40 MG capsule Take 40 mg by mouth daily.   OVER THE COUNTER MEDICATION Take 1 capsule by mouth daily. Cinsulin otc supplement   simvastatin (ZOCOR) 40 MG tablet Take 40 mg by mouth  daily.   TURMERIC PO Take 1,500 mg by mouth daily.   Ubiquinol 100 MG CAPS Take 100 mg by mouth daily.   vitamin E 180 MG (400 UNITS) capsule Take 400 Units by mouth daily.   Allergies:   Sulfa antibiotics   Social History   Socioeconomic History   Marital status: Married    Spouse name: Not on file   Number of children: 3   Years of education: Not on file   Highest education level: Not on file  Occupational History   Occupation: Retired-Sales  Tobacco Use   Smoking status: Former    Current packs/day: 0.00    Average packs/day: 1 pack/day for 20.0 years (20.0 ttl pk-yrs)    Types: Cigarettes    Start date: 09/06/1968    Quit date: 09/06/1988    Years since quitting: 35.1   Smokeless tobacco: Never  Vaping Use   Vaping status:  Never Used  Substance and Sexual Activity   Alcohol use: Yes    Alcohol/week: 7.0 standard drinks of alcohol    Types: 7 Standard drinks or equivalent per week   Drug use: Never   Sexual activity: Not on file  Other Topics Concern   Not on file  Social History Narrative   Not on file   Social Drivers of Health   Financial Resource Strain: Not on file  Food Insecurity: Not on file  Transportation Needs: Not on file  Physical Activity: Not on file  Stress: Not on file  Social Connections: Not on file    Family History: The patient's family history includes Heart attack (age of onset: 46) in his mother; Stroke in his father.  ROS:   Please see the history of present illness.     All other systems reviewed and are negative.  EKGs/Labs/Other Studies Reviewed:    The following studies were reviewed today: Cardiac Studies & Procedures   ______________________________________________________________________________________________     ECHOCARDIOGRAM  ECHOCARDIOGRAM COMPLETE 05/28/2020  Narrative ECHOCARDIOGRAM REPORT    Patient Name:   Gary Woodward Date of Exam: 05/28/2020 Medical Rec #:  [id number]      Height:       71.0 in Accession #:    [id number]     Weight:       204.0 lb Date of Birth:  08-17-1951     BSA:          2.126 m Patient Age:    68 years       BP:           114/80 mmHg Patient Gender: M              HR:           51 bpm. Exam Location:  [address]  Procedure: 2D Echo, Cardiac Doppler, Color Doppler, 3D Echo and Strain Analysis  Indications:    R07.9  History:        Patient has no prior history of Echocardiogram examinations. Signs/Symptoms:Chest Pain; Risk Factors:Hypertension, Diabetes, Dyslipidemia and Former Smoker.  Sonographer:    Samule Ohm RDCS Referring Phys: [id number] CHRISTOPHER D [facility]   Sonographer Comments: Global longitudinal strain was attempted. IMPRESSIONS   1. Left ventricular ejection fraction, by estimation, is  60 to 65%. The left ventricle has normal function. The left ventricle has no regional wall motion abnormalities. There is mild left ventricular hypertrophy. Left ventricular diastolic parameters were normal. GLS -21.8%, normal. 2. Right ventricular systolic function is normal. The right ventricular size is  normal. There is normal pulmonary artery systolic pressure. The estimated right ventricular systolic pressure is 21.7 mmHg. 3. Left atrial size was mildly dilated. 4. The mitral valve is normal in structure. Trivial mitral valve regurgitation. No evidence of mitral stenosis. 5. The aortic valve is tricuspid. Aortic valve regurgitation is not visualized. No aortic stenosis is present. 6. Aortic dilatation noted. There is mild dilatation of the ascending aorta, measuring 38 mm. 7. The inferior vena cava is normal in size with greater than 50% respiratory variability, suggesting right atrial pressure of 3 mmHg.  FINDINGS Left Ventricle: Left ventricular ejection fraction, by estimation, is 60 to 65%. The left ventricle has normal function. The left ventricle has no regional wall motion abnormalities. The left ventricular internal cavity size was normal in size. There is mild left ventricular hypertrophy. Left ventricular diastolic parameters were normal.  Right Ventricle: The right ventricular size is normal. No increase in right ventricular wall thickness. Right ventricular systolic function is normal. There is normal pulmonary artery systolic pressure. The tricuspid regurgitant velocity is 2.16 m/s, and with an assumed right atrial pressure of 3 mmHg, the estimated right ventricular systolic pressure is 21.7 mmHg.  Left Atrium: Left atrial size was mildly dilated.  Right Atrium: Right atrial size was normal in size.  Pericardium: There is no evidence of pericardial effusion.  Mitral Valve: The mitral valve is normal in structure. Trivial mitral valve regurgitation. No evidence of mitral valve  stenosis.  Tricuspid Valve: The tricuspid valve is normal in structure. Tricuspid valve regurgitation is trivial.  Aortic Valve: The aortic valve is tricuspid. Aortic valve regurgitation is not visualized. No aortic stenosis is present.  Pulmonic Valve: The pulmonic valve was normal in structure. Pulmonic valve regurgitation is trivial.  Aorta: Aortic dilatation noted. There is mild dilatation of the ascending aorta, measuring 38 mm.  Venous: The inferior vena cava is normal in size with greater than 50% respiratory variability, suggesting right atrial pressure of 3 mmHg.  IAS/Shunts: No atrial level shunt detected by color flow Doppler.   LEFT VENTRICLE PLAX 2D LVIDd:         4.40 cm  Diastology LVIDs:         3.00 cm  LV e' medial:    8.05 cm/s LV PW:         1.50 cm  LV E/e' medial:  9.8 LV IVS:        1.60 cm  LV e' lateral:   10.00 cm/s LVOT diam:     2.20 cm  LV E/e' lateral: 7.8 LV SV:         103 LV SV Index:   48 LVOT Area:     3.80 cm  3D Volume EF: 3D EF:        63 % LV EDV:       168 ml LV ESV:       63 ml LV SV:        105 ml  RIGHT VENTRICLE             IVC RV S prime:     13.80 cm/s  IVC diam: 1.20 cm TAPSE (M-mode): 2.1 cm RVSP:           21.7 mmHg  LEFT ATRIUM             Index       RIGHT ATRIUM           Index LA diam:  3.80 cm 1.79 cm/m  RA Pressure: 3.00 mmHg LA Vol (A2C):   69.7 ml 32.78 ml/m RA Area:     13.90 cm LA Vol (A4C):   79.3 ml 37.29 ml/m RA Volume:   34.90 ml  16.41 ml/m LA Biplane Vol: 78.7 ml 37.01 ml/m AORTIC VALVE LVOT Vmax:   126.00 cm/s LVOT Vmean:  73.700 cm/s LVOT VTI:    0.270 m  AORTA Ao Root diam: 3.70 cm Ao Asc diam:  3.80 cm  MV E velocity: 78.50 cm/s  TRICUSPID VALVE MV A velocity: 70.40 cm/s  TR Peak grad:   18.7 mmHg MV E/A ratio:  1.12        TR Vmax:        216.00 cm/s Estimated RAP:  3.00 mmHg RVSP:           21.7 mmHg  SHUNTS Systemic VTI:  0.27 m Systemic Diam: 2.20 cm  Marca Ancona  MD Electronically signed by Marca Ancona MD Signature Date/Time: 05/28/2020/2:36:55 PM    Final      CT SCANS  CT CORONARY FRACTIONAL FLOW RESERVE DATA PREP 05/22/2020  Narrative EXAM: CT FFR ANALYSIS  CLINICAL DATA:  Moderate stenosis in the LAD on coronary CTA  FINDINGS: FFRct analysis was performed on the original cardiac CT angiogram dataset. Diagrammatic representation of the FFRct analysis is provided in a separate PDF document in PACS. This dictation was created using the PDF document and an interactive 3D model of the results. 3D model is not available in the EMR/PACS. Normal FFR range is >0.80.  1. Left Main:  No significant stenosis.  2. LAD: No significant stenosis. CT FFR was 0.9 proximally, 0.8 in the mid vessel, slowyly tapers to 0.74 distally (no focal stenosis) 3. LCX: No significant stenosis. CT FFR was 0.95 proximally, 0.91 distally 4. RCA: No significant stenosis. CT FFR was 0.96 proximally, 0.86 distally  IMPRESSION: 1.  CT FFR analysis didn't show any significant stenosis.   Electronically Signed By: Debbe Odea M.D. On: 05/26/2020 17:17   CT CORONARY MORPH W/CTA COR W/SCORE 05/22/2020  Addendum 05/23/2020  7:46 AM ADDENDUM REPORT: 05/23/2020 07:43  EXAM: OVER-READ INTERPRETATION  CT CHEST  The following report is an over-read performed by radiologist Dr. Schuyler Amor [facility] on 05/23/2020. This over-read does not include interpretation of cardiac or coronary anatomy or pathology. The coronary calcium score and coronary CTA interpretation by the cardiologist is attached.  COMPARISON:  None.  FINDINGS: Vascular: Aortic atherosclerosis noted. No pericardial effusion identified.  Mediastinum/nodes: No mass or adenopathy.  Lungs/pleura: No acute findings identified within the imaged portions of the lower lungs. No pleural effusion, atelectasis or airspace consolidation.  Upper abdomen: No acute  abnormality.  Musculoskeletal no acute or suspicious osseous findings.  IMPRESSION: 1. No significant non-cardiac supplemental findings noted.  Aortic Atherosclerosis (ICD10-I70.0).   Electronically Signed By: Signa Kell M.D. On: 05/23/2020 07:43  Narrative CLINICAL DATA:  Chestpain  EXAM: Cardiac/Coronary  CTA  TECHNIQUE: The patient was scanned on a Siemens Somatoform go.Top scanner.  FINDINGS: A retrospective scan was triggered in the descending thoracic aorta. Axial non-contrast 3 mm slices were carried out through the heart. The data set was analyzed on a dedicated work station and scored using the Agatson method. Gantry rotation speed was 330 msecs and collimation was .6 mm. 100mg  of metoprolol and 0.8 mg of sl NTG was given. The 3D data set was reconstructed in 5% intervals of the 45-95 % of the R-R cycle.  Diastolic phases were analyzed on a dedicated work station using MPR, MIP and VRT modes. The patient received 75 cc of contrast.  Aorta: Normal size. Ascending aortic wall and arch calcifications. No dissection.  Aortic Valve:  Trileaflet.  No calcifications.  Coronary Arteries:  Normal coronary origin.  Right dominance.  RCA is a large dominant artery that gives rise to PDA and PLA. There is mild calcified plaque in the mid and distal RCA causing minimal (0-24%) stenosis.  Left main is a large artery that gives rise to LAD and LCX arteries.  LAD is a large vessel that has calcified and non calcified plaque in the proximal segment causing moderate stenosis (50-69%)  LCX is a non-dominant artery that gives rise to one large OM1 branch. There is calcified plaque in the proximal segment causing mild stenosis (25-49%).  Other findings:  Normal pulmonary vein drainage into the left atrium.  Normal left atrial appendage without a thrombus.  Normal size of the pulmonary artery.  IMPRESSION: 1. Coronary calcium score of 482. This was 76th percentile  for age and sex matched control.  2. Normal coronary origin with right dominance.  3. Calcified and non calcified plaque in the proximal LAD causing moderate stenosis (50-69%).  4. Calcified plaque in the proximal LCx causing mild stenosis (25-49%)  5. CAD-RADS 3. Moderate stenosis. Consider symptom-guided anti-ischemic pharmacotherapy as well as risk factor modification per guideline directed care. Additional analysis with CT FFR will be submitted and reported separately.  .  Electronically Signed: By: Debbe Odea M.D. On: 05/22/2020 14:45     ______________________________________________________________________________________________           Recent Labs: No results found for requested labs within last 365 days.  Recent Lipid Panel No results found for: "CHOL", "TRIG", "HDL", "CHOLHDL", "VLDL", "LDLCALC", "LDLDIRECT"  Physical Exam:    VS:  BP 108/82   Pulse 73   Ht 5\' 11"  (1.803 m)   Wt 197 lb (89.4 kg)   SpO2 96%   BMI 27.48 kg/m     Wt Readings from Last 3 Encounters:  11/02/23 197 lb (89.4 kg)  11/02/22 204 lb 9.6 oz (92.8 kg)  12/25/21 205 lb (93 kg)    General: Well developed, well nourished, NAD Lungs:Clear to ausculation bilaterally. No wheezes, rales, or rhonchi. Breathing is unlabored. Cardiovascular: RRR with S1 S2. No murmurs Extremities: No edema.  Neuro: Alert and oriented. No focal deficits. No facial asymmetry. MAE spontaneously. Psych: Responds to questions appropriately with normal affect.    ASSESSMENT/PLAN:    CAD: Noted on prior cCTA in the mild to moderate range with no anginal symptoms. Continue current regimen with ASA, statin and Zetia.   HTN: Low today which is reported as his normal. No dizziness. Okay to stop Lisinopril if PCP and patient decide.   HLD: Plan lipid panel with wellness check next month.      Medication Adjustments/Labs and Tests Ordered: Current medicines are reviewed at length with the patient  today.  Concerns regarding medicines are outlined above.  Orders Placed This Encounter  Procedures   EKG 12-Lead   No orders of the defined types were placed in this encounter.   Patient Instructions  Medication Instructions:  Your physician recommends that you continue on your current medications as directed. Please refer to the Current Medication list given to you today.  *If you need a refill on your cardiac medications before your next appointment, please call your pharmacy*   Lab Work: None ordered  If you have labs (blood work) drawn today and your tests are completely normal, you will receive your results only by: MyChart Message (if you have MyChart) OR A paper copy in the mail If you have any lab test that is abnormal or we need to change your treatment, we will call you to review the results.   Testing/Procedures: None ordered   Follow-Up: At [facility], you and your health needs are our priority.  As part of our continuing mission to provide you with exceptional heart care, we have created designated Provider Care Teams.  These Care Teams include your primary Cardiologist (physician) and Advanced Practice Providers (APPs -  Physician Assistants and Nurse Practitioners) who all work together to provide you with the care you need, when you need it.  We recommend signing up for the patient portal called "MyChart".  Sign up information is provided on this After Visit Summary.  MyChart is used to connect with patients for Virtual Visits (Telemedicine).  Patients are able to view lab/test results, encounter notes, upcoming appointments, etc.  Non-urgent messages can be sent to your provider as well.   To learn more about what you can do with MyChart, go to [email or web address].    Your next appointment:   1 year(s)  Provider:   Verne Carrow, MD     Other Instructions    1st Floor: - Lobby - Registration  - Pharmacy  - Lab - Cafe  2nd  Floor: - PV Lab - Diagnostic Testing (echo, CT, nuclear med)  3rd Floor: - Vacant  4th Floor: - TCTS (cardiothoracic surgery) - AFib Clinic - Structural Heart Clinic - Vascular Surgery  - Vascular Ultrasound  5th Floor: - HeartCare Cardiology (general and EP) - Clinical Pharmacy for coumadin, hypertension, lipid, weight-loss medications, and med management appointments    Valet parking services will be available as well.     Signed, Georgie Chard, NP  11/02/2023 1:17 PM    [facility]

## 2023-11-02 NOTE — Patient Instructions (Signed)
 Medication Instructions:  Your physician recommends that you continue on your current medications as directed. Please refer to the Current Medication list given to you today.  *If you need a refill on your cardiac medications before your next appointment, please call your pharmacy*   Lab Work: None ordered  If you have labs (blood work) drawn today and your tests are completely normal, you will receive your results only by: MyChart Message (if you have MyChart) OR A paper copy in the mail If you have any lab test that is abnormal or we need to change your treatment, we will call you to review the results.   Testing/Procedures: None ordered   Follow-Up: At [facility], you and your health needs are our priority.  As part of our continuing mission to provide you with exceptional heart care, we have created designated Provider Care Teams.  These Care Teams include your primary Cardiologist (physician) and Advanced Practice Providers (APPs -  Physician Assistants and Nurse Practitioners) who all work together to provide you with the care you need, when you need it.  We recommend signing up for the patient portal called "MyChart".  Sign up information is provided on this After Visit Summary.  MyChart is used to connect with patients for Virtual Visits (Telemedicine).  Patients are able to view lab/test results, encounter notes, upcoming appointments, etc.  Non-urgent messages can be sent to your provider as well.   To learn more about what you can do with MyChart, go to [email or web address].    Your next appointment:   1 year(s)  Provider:   Verne Carrow, MD     Other Instructions    1st Floor: - Lobby - Registration  - Pharmacy  - Lab - Cafe  2nd Floor: - PV Lab - Diagnostic Testing (echo, CT, nuclear med)  3rd Floor: - Vacant  4th Floor: - TCTS (cardiothoracic surgery) - AFib Clinic - Structural Heart Clinic - Vascular Surgery  - Vascular  Ultrasound  5th Floor: - HeartCare Cardiology (general and EP) - Clinical Pharmacy for coumadin, hypertension, lipid, weight-loss medications, and med management appointments    Valet parking services will be available as well.

## 2023-11-03 ENCOUNTER — Ambulatory Visit: Payer: Medicare Other | Attending: Cardiovascular Disease | Admitting: Cardiovascular Disease

## 2023-11-03 ENCOUNTER — Telehealth: Payer: Self-pay | Admitting: Cardiovascular Disease

## 2023-11-03 ENCOUNTER — Encounter: Payer: Self-pay | Admitting: Cardiovascular Disease

## 2023-11-03 VITALS — BP 110/62 | HR 79 | Ht 71.0 in | Wt 196.4 lb

## 2023-11-03 DIAGNOSIS — R072 Precordial pain: Secondary | ICD-10-CM

## 2023-11-03 DIAGNOSIS — I2510 Atherosclerotic heart disease of native coronary artery without angina pectoris: Secondary | ICD-10-CM

## 2023-11-03 DIAGNOSIS — I10 Essential (primary) hypertension: Secondary | ICD-10-CM

## 2023-11-03 DIAGNOSIS — I480 Paroxysmal atrial fibrillation: Secondary | ICD-10-CM

## 2023-11-03 MED ORDER — APIXABAN 5 MG PO TABS: 5.0000 mg | ORAL_TABLET | Freq: Two times a day (BID) | ORAL | 3 refills | Status: DC

## 2023-11-03 MED ORDER — METOPROLOL SUCCINATE ER 25 MG PO TB24: 25.0000 mg | ORAL_TABLET | Freq: Every day | ORAL | 3 refills | Status: DC

## 2023-11-03 MED ORDER — APIXABAN 5 MG PO TABS: 5.0000 mg | ORAL_TABLET | Freq: Two times a day (BID) | ORAL | 0 refills | Status: DC

## 2023-11-03 MED ORDER — APIXABAN 5 MG PO TABS: 5.0000 mg | ORAL_TABLET | Freq: Two times a day (BID) | ORAL | Status: DC

## 2023-11-03 MED ORDER — METOPROLOL SUCCINATE ER 25 MG PO TB24: 25.0000 mg | ORAL_TABLET | Freq: Every day | ORAL | 0 refills | Status: DC

## 2023-11-03 NOTE — Telephone Encounter (Signed)
 Patient c/o Palpitations:  High priority if patient c/o lightheadedness, shortness of breath, or chest pain  How long have you had palpitations/irregular HR/ Afib? Are you having the symptoms now? EKG showed today, but last night had some fluttering, does not currently has symptoms now.  Are you currently experiencing lightheadedness, SOB or CP? No   Do you have a history of afib (atrial fibrillation) or irregular heart rhythm? No   Have you checked your BP or HR? (document readings if available): 103/80 HR 100  Are you experiencing any other symptoms? Rapid HR is only noticed   Patient is in a medicine study and was told today that his EKG was ready some afib. Patient was advised to call his cardiologist to see what he may need to do for this. Please advise.

## 2023-11-03 NOTE — Telephone Encounter (Signed)
 Spoke with Dr Clifton James who recommends pt be seen today.  Pt scheduled with Dr Clifton James at 240pm.  Pt notified and is agreeable.

## 2023-11-03 NOTE — Patient Instructions (Signed)
 Medication Instructions:  STOP Aspirin STOP Lisinopril START Eliquis 5mg  twice daily START Metoprolol Succinate 25mg  daily *If you need a refill on your cardiac medications before your next appointment, please call your pharmacy*  Lab Work: BMET and CBC today If you have labs (blood work) drawn today and your tests are completely normal, you will receive your results only by: MyChart Message (if you have MyChart) OR A paper copy in the mail If you have any lab test that is abnormal or we need to change your treatment, we will call you to review the results.   Testing/Procedures: ECHO Your physician has requested that you have an echocardiogram. Echocardiography is a painless test that uses sound waves to create images of your heart. It provides your doctor with information about the size and shape of your heart and how well your heart's chambers and valves are working. This procedure takes approximately one hour. There are no restrictions for this procedure. Please do NOT wear cologne, perfume, aftershave, or lotions (deodorant is allowed). Please arrive 15 minutes prior to your appointment time.  Please note: We ask at that you not bring children with you during ultrasound (echo/ vascular) testing. Due to room size and safety concerns, children are not allowed in the ultrasound rooms during exams. Our front office staff cannot provide observation of children in our lobby area while testing is being conducted. An adult accompanying a patient to their appointment will only be allowed in the ultrasound room at the discretion of the ultrasound technician under special circumstances. We apologize for any inconvenience.  Follow-Up: At [facility], you and your health needs are our priority.  As part of our continuing mission to provide you with exceptional heart care, we have created designated Provider Care Teams.  These Care Teams include your primary Cardiologist (physician) and  Advanced Practice Providers (APPs -  Physician Assistants and Nurse Practitioners) who all work together to provide you with the care you need, when you need it.  Your next appointment:   7 week(s)  Provider:   Earney Hamburg or APP     1st Floor: - Lobby - Registration  - Pharmacy  - Lab - Cafe  2nd Floor: - PV Lab - Diagnostic Testing (echo, CT, nuclear med)  3rd Floor: - Vacant  4th Floor: - TCTS (cardiothoracic surgery) - AFib Clinic - Structural Heart Clinic - Vascular Surgery  - Vascular Ultrasound  5th Floor: - HeartCare Cardiology (general and EP) - Clinical Pharmacy for coumadin, hypertension, lipid, weight-loss medications, and med management appointments    Valet parking services will be available as well.

## 2023-11-03 NOTE — Telephone Encounter (Signed)
 Spoke with pt who reports he was seen this morning by his drug trial provider, Dr Dayton Scrape and was advised he is currently in Afib.  Pt states he has experienced this fluttery feeling before but cannot say when or how often.  Pt denies current symptoms of CP, SOB or dizziness.   Pt advised will forward to Dr Clifton James and his RN for further review and recommendation.  Reviewed ED precautions.  Pt verbalizes understanding and agrees with current plan.

## 2023-11-03 NOTE — Progress Notes (Signed)
 Chief Complaint  Patient presents with   Follow-up    Atrial fibrillation   History of Present Illness: 73 yo male with history of CAD, DM, HTN, RBBB and HLD who is here today for cardiac follow up. I saw him as a new consult in September 2021 for the evaluation of chest pressure. Echo September 2021 with LVEF=60-65%. Gated cardiac CTA September 2021 with mild to moderate CAD, no focally obstructive lesions by CT FFR. He was seen in our office yesterday by Georgie Chard, NP and was doing well with no complaints. EKG on 11/03/23 with sinus rhythm with RBBB. He went in for testing today for a research study and was told that he was in atrial fibrillation. EKG shows atrial fib with HR of 124 bpm.   The patient denies any chest pain, dyspnea, lower extremity edema, orthopnea, PND, dizziness, near syncope or syncope. He felt his heart racing last night and felt fatigued. He is now in sinus and feels better.   Primary Care Physician: Noberto Retort, MD  Past Medical History:  Diagnosis Date   Anginal pain (HCC) 03/2020   anxiety   Arthritis    knees   Diabetes mellitus without complication (HCC)    GERD (gastroesophageal reflux disease)    Hyperlipidemia    Hypertension    Sleep apnea    c-pap    Past Surgical History:  Procedure Laterality Date   KNEE ARTHROSCOPY Bilateral 1990-97   x 3   PARTIAL KNEE ARTHROPLASTY Left 10/27/2020   Procedure: UNICOMPARTMENTAL KNEE;  Surgeon: Dannielle Huh, MD;  Location: [facility];  Service: Orthopedics;  Laterality: Left;   right knee replacement     SPLENECTOMY  1966   TONSILLECTOMY  1995    Current Outpatient Medications  Medication Sig Dispense Refill   acetaminophen (TYLENOL) 500 MG tablet Take 500 mg by mouth every 8 (eight) hours as needed for moderate pain or headache.     apixaban (ELIQUIS) 5 MG TABS tablet Take 1 tablet (5 mg total) by mouth 2 (two) times daily. 180 tablet 3   apixaban (ELIQUIS) 5 MG TABS tablet Take 1 tablet (5 mg  total) by mouth 2 (two) times daily.     apixaban (ELIQUIS) 5 MG TABS tablet Take 1 tablet (5 mg total) by mouth 2 (two) times daily. 60 tablet 0   b complex vitamins capsule Take 1 capsule by mouth daily.     cholecalciferol (VITAMIN D3) 25 MCG (1000 UNIT) tablet Take 1,000 Units by mouth daily.     ezetimibe (ZETIA) 10 MG tablet TAKE 1 TABLET BY MOUTH DAILY 100 tablet 0   fluticasone (FLONASE) 50 MCG/ACT nasal spray Place 1 spray into both nostrils daily. 16 g 0   Glucosamine HCl-MSM (GLUCOSAMINE-MSM PO) Take 1 tablet by mouth daily.     glucose blood (ONETOUCH ULTRA) test strip as directed.     Krill Oil 500 MG CAPS Take 500 mg by mouth daily.     Lancets (ONETOUCH DELICA PLUS LANCET30G) MISC as directed.     metFORMIN (GLUCOPHAGE) 500 MG tablet Take 1,500 mg by mouth 2 (two) times daily with a meal. Pt takes 1000 mg in the morning and 1000 mg in the at night.     metoprolol succinate (TOPROL XL) 25 MG 24 hr tablet Take 1 tablet (25 mg total) by mouth daily. 30 tablet 0   metoprolol succinate (TOPROL XL) 25 MG 24 hr tablet Take 1 tablet (25 mg total) by mouth daily.  90 tablet 3   MOUNJARO 5 MG/0.5ML Pen Inject 5 mg into the skin once a week.     nitroGLYCERIN (NITROSTAT) 0.4 MG SL tablet Place 0.4 mg under the tongue every 5 (five) minutes as needed for chest pain.     omeprazole (PRILOSEC) 40 MG capsule Take 40 mg by mouth daily.     OVER THE COUNTER MEDICATION Take 1 capsule by mouth daily. Cinsulin otc supplement     simvastatin (ZOCOR) 40 MG tablet Take 40 mg by mouth daily.     TURMERIC PO Take 1,500 mg by mouth daily.     Ubiquinol 100 MG CAPS Take 100 mg by mouth daily.     vitamin E 180 MG (400 UNITS) capsule Take 400 Units by mouth daily.     No current facility-administered medications for this visit.    Allergies  Allergen Reactions   Sulfa Antibiotics     Unknown reaction    Social History   Socioeconomic History   Marital status: Married    Spouse name: Not on file    Number of children: 3   Years of education: Not on file   Highest education level: Not on file  Occupational History   Occupation: Retired-Sales  Tobacco Use   Smoking status: Former    Current packs/day: 0.00    Average packs/day: 1 pack/day for 20.0 years (20.0 ttl pk-yrs)    Types: Cigarettes    Start date: 09/06/1968    Quit date: 09/06/1988    Years since quitting: 35.1   Smokeless tobacco: Never  Vaping Use   Vaping status: Never Used  Substance and Sexual Activity   Alcohol use: Yes    Alcohol/week: 7.0 standard drinks of alcohol    Types: 7 Standard drinks or equivalent per week   Drug use: Never   Sexual activity: Not on file  Other Topics Concern   Not on file  Social History Narrative   Not on file   Social Drivers of Health   Financial Resource Strain: Not on file  Food Insecurity: Not on file  Transportation Needs: Not on file  Physical Activity: Not on file  Stress: Not on file  Social Connections: Not on file  Intimate Partner Violence: Not on file    Family History  Problem Relation Age of Onset   Heart attack Mother 48   Stroke Father     Review of Systems:  As stated in the HPI and otherwise negative.   BP 110/62   Pulse 79   Ht 5\' 11"  (1.803 m)   Wt 89.1 kg   SpO2 97%   BMI 27.39 kg/m   Physical Examination: General: Well developed, well nourished, NAD  HEENT: OP clear, mucus membranes moist  SKIN: warm, dry. No rashes. Neuro: No focal deficits  Musculoskeletal: Muscle strength 5/5 all ext  Psychiatric: Mood and affect normal  Neck: No JVD, no carotid bruits, no thyromegaly, no lymphadenopathy.  Lungs:Clear bilaterally, no wheezes, rhonci, crackles Cardiovascular: RRR. No murmurs, gallops or rubs. Abdomen:Soft. Bowel sounds present. Non-tender.  Extremities: No lower extremity edema. Pulses are 2 + in the bilateral DP/PT.  EKG:  EKG is ordered today. The ekg ordered today demonstrates  EKG Interpretation Date/Time:  Thursday  November 03 2023 14:45:48 EST Ventricular Rate:  79 PR Interval:  214 QRS Duration:  130 QT Interval:  382 QTC Calculation: 438 R Axis:   -81  Text Interpretation: Sinus rhythm with 1st degree A-V block Left axis deviation Right  bundle branch block When compared with ECG of 02-Nov-2023 10:28, No significant change was found Confirmed by Verne Carrow [phone]) on 11/03/2023 2:50:19 PM   Echo 05/28/20:  1. Left ventricular ejection fraction, by estimation, is 60 to 65%. The  left ventricle has normal function. The left ventricle has no regional  wall motion abnormalities. There is mild left ventricular hypertrophy.  Left ventricular diastolic parameters  were normal. GLS -21.8%, normal.   2. Right ventricular systolic function is normal. The right ventricular  size is normal. There is normal pulmonary artery systolic pressure. The  estimated right ventricular systolic pressure is 21.7 mmHg.   3. Left atrial size was mildly dilated.   4. The mitral valve is normal in structure. Trivial mitral valve  regurgitation. No evidence of mitral stenosis.   5. The aortic valve is tricuspid. Aortic valve regurgitation is not  visualized. No aortic stenosis is present.   6. Aortic dilatation noted. There is mild dilatation of the ascending  aorta, measuring 38 mm.   7. The inferior vena cava is normal in size with greater than 50%  respiratory variability, suggesting right atrial pressure of 3 mmHg.   Recent Labs: No results found for requested labs within last 365 days.   Lipid Panel No results found for: "CHOL", "TRIG", "HDL", "CHOLHDL", "VLDL", "LDLCALC", "LDLDIRECT"   Wt Readings from Last 3 Encounters:  11/03/23 89.1 kg  11/02/23 89.4 kg  11/02/22 92.8 kg    Assessment and Plan:   1. CAD without angina: Mild to moderate non-obstructive CAD by cardiac CTA September 2021. No chest pain suggestive of angina. Continue statin and Zetia. Will stop ASA today.   2. Atrial fibrillation,  paroxysmal: Sinus now but he was in atrial fib this am. Will stop Lisinopril. Will start Toprol 25 mg daily. Will start Eliquis 5 mg po BID. CHADS VASC score of 4. Will stop ASA. Will arrange an echo to assess LV size and function.   3. HTN: BP is controlled. Patrecia Pace stop Lisinopril and start Toprol 25 mg daily.    Labs/ tests ordered today include:   Orders Placed This Encounter  Procedures   CBC   Basic metabolic panel   EKG 12-Lead   ECHOCARDIOGRAM COMPLETE   Disposition:   F/U with me or office APP in 6 weeks  Signed, Verne Carrow, MD 11/03/2023 4:13 PM    [facility] [address] [place], [place], [place]  [address] Phone: [phone]; Fax: [phone]

## 2023-11-04 ENCOUNTER — Ambulatory Visit: Payer: Medicare Other | Admitting: Cardiology

## 2023-11-04 LAB — BASIC METABOLIC PANEL
BUN/Creatinine Ratio: 18 (ref 10–24)
BUN: 15 mg/dL (ref 8–27)
CO2: 22 mmol/L (ref 20–29)
Calcium: 10 mg/dL (ref 8.6–10.2)
Chloride: 102 mmol/L (ref 96–106)
Creatinine, Ser: 0.83 mg/dL (ref 0.76–1.27)
Glucose: 102 mg/dL — ABNORMAL HIGH (ref 70–99)
Potassium: 5.2 mmol/L (ref 3.5–5.2)
Sodium: 138 mmol/L (ref 134–144)
eGFR: 93 mL/min/{1.73_m2} (ref >59)

## 2023-11-04 LAB — CBC
Hematocrit: 47.3 % (ref 37.5–51.0)
Hemoglobin: 16 g/dL (ref 13.0–17.7)
MCH: 29.6 pg (ref 26.6–33.0)
MCHC: 33.8 g/dL (ref 31.5–35.7)
MCV: 87 fL (ref 79–97)
Platelets: 436 10*3/uL (ref 150–450)
RBC: 5.41 x10E6/uL (ref 4.14–5.80)
RDW: 13.7 % (ref 11.6–15.4)
WBC: 13.1 10*3/uL — ABNORMAL HIGH (ref 3.4–10.8)

## 2023-11-25 ENCOUNTER — Ambulatory Visit (HOSPITAL_COMMUNITY): Payer: Medicare Other | Attending: Cardiology

## 2023-11-25 DIAGNOSIS — I10 Essential (primary) hypertension: Secondary | ICD-10-CM | POA: Diagnosis not present

## 2023-11-25 DIAGNOSIS — I480 Paroxysmal atrial fibrillation: Secondary | ICD-10-CM | POA: Diagnosis not present

## 2023-11-25 DIAGNOSIS — R072 Precordial pain: Secondary | ICD-10-CM | POA: Diagnosis not present

## 2023-11-25 DIAGNOSIS — I2510 Atherosclerotic heart disease of native coronary artery without angina pectoris: Secondary | ICD-10-CM | POA: Diagnosis not present

## 2023-11-25 LAB — ECHOCARDIOGRAM COMPLETE
Area-P 1/2: 3.6 cm2
S' Lateral: 3.8 cm

## 2023-12-12 DIAGNOSIS — E119 Type 2 diabetes mellitus without complications: Secondary | ICD-10-CM | POA: Diagnosis not present

## 2023-12-28 NOTE — Progress Notes (Unsigned)
 Cardiology Office Note    Patient Name: Gary Woodward Date of Encounter: 12/28/2023  Primary Care Provider:  Roselind Congo, MD Primary Cardiologist:  Antoinette Batman, MD Primary Electrophysiologist: None   Past Medical History    Past Medical History:  Diagnosis Date   Anginal pain (HCC) 03/2020   anxiety   Arthritis    knees   Diabetes mellitus without complication (HCC)    GERD (gastroesophageal reflux disease)    Hyperlipidemia    Hypertension    Sleep apnea    c-pap    History of Present Illness  Gary Woodward is a 73 y.o. male with PMH of paroxysmal AF (on Eliquis ), nonobstructive CAD, HTN, HLD, DM type II who presents today for 1 month follow-up of atrial fibrillation.  Gary Woodward was seen most recently by Dr. Abel Hoe for new onset atrial fibrillation.  He had followed up at research study and underwent EKG that showed atrial fibrillation with a heart rate of 124 bpm.  He was symptomatic with fatigue and EKG during follow-up showed sinus rhythm.  He had aspirin  discontinued and was started on Eliquis  5 mg twice daily.  He was also started on Toprol -XL 25 mg and underwent 2D echo for further evaluation.  2D echo showed EF of 55 to 60% with no RWMA and grade 1 DD with no significant valve disease.  Patient denies chest pain, palpitations, dyspnea, PND, orthopnea, nausea, vomiting, dizziness, syncope, edema, weight gain, or early satiety.   Discussed the use of AI scribe software for clinical note transcription with the patient, who gave verbal consent to proceed.  History of Present Illness    ***Notes: -Last ischemic evaluation:  Review of Systems  Please see the history of present illness.    All other systems reviewed and are otherwise negative except as noted above.  Physical Exam    Wt Readings from Last 3 Encounters:  11/03/23 196 lb 6.4 oz (89.1 kg)  11/02/23 197 lb (89.4 kg)  11/02/22 204 lb 9.6 oz (92.8 kg)   ZO:XWRUE were no vitals filed  for this visit.,There is no height or weight on file to calculate BMI. GEN: Well nourished, well developed in no acute distress Neck: No JVD; No carotid bruits Pulmonary: Clear to auscultation without rales, wheezing or rhonchi  Cardiovascular: Normal rate. Regular rhythm. Normal S1. Normal S2.   Murmurs: There is no murmur.  ABDOMEN: Soft, non-tender, non-distended EXTREMITIES:  No edema; No deformity   EKG/LABS/ Recent Cardiac Studies   ECG personally reviewed by me today - ***  Risk Assessment/Calculations:   {Does this patient have ATRIAL FIBRILLATION?:[phone]}      Lab Results  Component Value Date   WBC 13.1 (H) 11/03/2023   HGB 16.0 11/03/2023   HCT 47.3 11/03/2023   MCV 87 11/03/2023   PLT 436 11/03/2023   Lab Results  Component Value Date   CREATININE 0.83 11/03/2023   BUN 15 11/03/2023   NA 138 11/03/2023   K 5.2 11/03/2023   CL 102 11/03/2023   CO2 22 11/03/2023   No results found for: "CHOL", "HDL", "LDLCALC", "LDLDIRECT", "TRIG", "CHOLHDL"  Lab Results  Component Value Date   HGBA1C 7.4 (H) 10/17/2020   Assessment & Plan    1.  Paroxysmal AF:  2.  Nonobstructive CAD  3.  HLD  4.  HTN      Disposition: Follow-up with Antoinette Batman, MD or APP in *** months {Are you ordering a CV Procedure (e.g. stress test, cath,  DCCV, TEE, etc)?   Press F2        :[id number]}   Signed, Francene Ing, Retha Cast, NP 12/28/2023, 10:46 AM [facility]

## 2023-12-29 ENCOUNTER — Encounter: Payer: Self-pay | Admitting: Nurse Practitioner

## 2023-12-29 ENCOUNTER — Ambulatory Visit: Payer: Medicare Other | Attending: Nurse Practitioner | Admitting: Nurse Practitioner

## 2023-12-29 VITALS — BP 124/76 | HR 64 | Ht 71.0 in | Wt 195.0 lb

## 2023-12-29 DIAGNOSIS — E785 Hyperlipidemia, unspecified: Secondary | ICD-10-CM | POA: Diagnosis not present

## 2023-12-29 DIAGNOSIS — I480 Paroxysmal atrial fibrillation: Secondary | ICD-10-CM

## 2023-12-29 DIAGNOSIS — I10 Essential (primary) hypertension: Secondary | ICD-10-CM

## 2023-12-29 DIAGNOSIS — I2510 Atherosclerotic heart disease of native coronary artery without angina pectoris: Secondary | ICD-10-CM | POA: Diagnosis not present

## 2023-12-29 NOTE — Patient Instructions (Signed)
 Medication Instructions:  Your physician recommends that you continue on your current medications as directed. Please refer to the Current Medication list given to you today. *If you need a refill on your cardiac medications before your next appointment, please call your pharmacy*  Lab Work: None ordered If you have labs (blood work) drawn today and your tests are completely normal, you will receive your results only by: MyChart Message (if you have MyChart) OR A paper copy in the mail If you have any lab test that is abnormal or we need to change your treatment, we will call you to review the results.  Testing/Procedures: None ordered  Follow-Up: At [facility], you and your health needs are our priority.  As part of our continuing mission to provide you with exceptional heart care, our providers are all part of one team.  This team includes your primary Cardiologist (physician) and Advanced Practice Providers or APPs (Physician Assistants and Nurse Practitioners) who all work together to provide you with the care you need, when you need it.  Your next appointment:   6 month(s)  Provider:   Antoinette Batman, MD  or APP   We recommend signing up for the patient portal called "MyChart".  Sign up information is provided on this After Visit Summary.  MyChart is used to connect with patients for Virtual Visits (Telemedicine).  Patients are able to view lab/test results, encounter notes, upcoming appointments, etc.  Non-urgent messages can be sent to your provider as well.   To learn more about what you can do with MyChart, go to [email or web address].   Other Instructions       1st Floor: - Lobby - Registration  - Pharmacy  - Lab - Cafe  2nd Floor: - PV Lab - Diagnostic Testing (echo, CT, nuclear med)  3rd Floor: - Vacant  4th Floor: - TCTS (cardiothoracic surgery) - AFib Clinic - Structural Heart Clinic - Vascular Surgery  - Vascular Ultrasound  5th  Floor: - HeartCare Cardiology (general and EP) - Clinical Pharmacy for coumadin, hypertension, lipid, weight-loss medications, and med management appointments    Valet parking services will be available as well.

## 2024-01-04 ENCOUNTER — Other Ambulatory Visit: Payer: Self-pay | Admitting: Cardiovascular Disease

## 2024-01-06 MED ORDER — EZETIMIBE 10 MG PO TABS: 10.0000 mg | ORAL_TABLET | Freq: Every day | ORAL | 3 refills | Status: AC

## 2024-03-15 DIAGNOSIS — E7800 Pure hypercholesterolemia, unspecified: Secondary | ICD-10-CM | POA: Diagnosis not present

## 2024-03-15 DIAGNOSIS — Z862 Personal history of diseases of the blood and blood-forming organs and certain disorders involving the immune mechanism: Secondary | ICD-10-CM | POA: Diagnosis not present

## 2024-03-15 DIAGNOSIS — I480 Paroxysmal atrial fibrillation: Secondary | ICD-10-CM | POA: Diagnosis not present

## 2024-03-15 DIAGNOSIS — E1165 Type 2 diabetes mellitus with hyperglycemia: Secondary | ICD-10-CM | POA: Diagnosis not present

## 2024-03-15 DIAGNOSIS — I10 Essential (primary) hypertension: Secondary | ICD-10-CM | POA: Diagnosis not present

## 2024-03-15 DIAGNOSIS — K219 Gastro-esophageal reflux disease without esophagitis: Secondary | ICD-10-CM | POA: Diagnosis not present

## 2024-06-23 ENCOUNTER — Other Ambulatory Visit: Payer: Self-pay | Admitting: Cardiovascular Disease

## 2024-06-27 NOTE — Telephone Encounter (Signed)
 Pt last saw Jackee Alberts, NP on 12/29/23, last labs 11/03/23 Creat 0.83, age 73, weight 88.5kg, based on specified criteria pt is on appropriate dosage of Eliquis  5mg  BID for afib.  Will refill rx.

## 2024-09-26 ENCOUNTER — Other Ambulatory Visit: Payer: Self-pay | Admitting: Cardiovascular Disease

## 2024-12-11 ENCOUNTER — Ambulatory Visit: Admitting: Cardiovascular Disease
